# Patient Record
Sex: Female | Born: 1975 | Race: Black or African American | Hispanic: No | Marital: Single | Smoking: Current every day smoker
Health system: Southern US, Community
[De-identification: ages and names within clinical notes are randomized; demographics above are authoritative.]

## PROBLEM LIST (undated history)

## (undated) DIAGNOSIS — E785 Hyperlipidemia, unspecified: Secondary | ICD-10-CM

## (undated) DIAGNOSIS — J45909 Unspecified asthma, uncomplicated: Secondary | ICD-10-CM

## (undated) HISTORY — DX: Hyperlipidemia, unspecified: E78.5

## (undated) HISTORY — PX: FOOT SURGERY: SHX648

## (undated) HISTORY — DX: Unspecified asthma, uncomplicated: J45.909

---

## 2004-11-06 ENCOUNTER — Emergency Department: Payer: Self-pay | Admitting: Emergency Medicine

## 2005-02-18 ENCOUNTER — Emergency Department: Payer: Self-pay | Admitting: Emergency Medicine

## 2007-02-01 ENCOUNTER — Emergency Department: Payer: Self-pay | Admitting: Emergency Medicine

## 2007-12-31 ENCOUNTER — Ambulatory Visit: Payer: Self-pay

## 2009-07-02 ENCOUNTER — Emergency Department: Payer: Self-pay | Admitting: Emergency Medicine

## 2009-11-19 ENCOUNTER — Emergency Department: Payer: Self-pay | Admitting: Emergency Medicine

## 2010-04-14 ENCOUNTER — Emergency Department: Payer: Self-pay | Admitting: Emergency Medicine
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
## 2012-12-14 ENCOUNTER — Emergency Department: Payer: Self-pay | Admitting: Emergency Medicine

## 2012-12-14 LAB — URINALYSIS, COMPLETE
Bacteria: NONE SEEN
Bilirubin,UR: NEGATIVE
Glucose,UR: NEGATIVE mg/dL (ref 0–75)
Ketone: NEGATIVE
Leukocyte Esterase: NEGATIVE
Nitrite: NEGATIVE
Ph: 5 (ref 4.5–8.0)
Protein: NEGATIVE
RBC,UR: 9 /HPF (ref 0–5)
Specific Gravity: 1.032 (ref 1.003–1.030)
Squamous Epithelial: 1
WBC UR: 1 /HPF (ref 0–5)

## 2014-08-07 ENCOUNTER — Ambulatory Visit: Payer: Self-pay | Admitting: Podiatry

## 2014-08-11 ENCOUNTER — Ambulatory Visit: Payer: Self-pay | Admitting: Podiatry

## 2014-08-20 ENCOUNTER — Emergency Department: Payer: Self-pay | Admitting: Emergency Medicine

## 2014-10-15 NOTE — Op Note (Signed)
PATIENT NAME:  Kimberly Burns, Kimberly Burns MR#:  [id number] DATE OF BIRTH:  04-04-1976  DATE OF PROCEDURE:  08/11/2014  SURGEON: Linus Galas, DPM  PREOPERATIVE DIAGNOSIS: Hallux valgus deformity, left foot.   POSTOPERATIVE DIAGNOSIS: Hallux valgus deformity, left foot.   PROCEDURES: 1.  Modified McBride bunionectomy, left foot.  2.  Base wedge osteotomy, left first metatarsal.   ANESTHESIA: General endotracheal.  HEMOSTASIS: Pneumatic tourniquet, left ankle, 250 mmHg.   ESTIMATED BLOOD LOSS: Minimal.   MATERIALS: 15 mm compression staple, 13 mm compression staple.   PATHOLOGY: None.   COMPLICATIONS: None apparent.   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 She has tried conservative care outpatient and elects for surgical correction of the deformity.   OPERATIVE PROCEDURE: The patient was taken to the Operating Room and placed on the table in the supine position. Following satisfactory general endotracheal anesthesia, the left foot was anesthetized with 10 mL of 0.5% Sensorcaine plain around the medial aspect of the foot at the base of the first metatarsal. A pneumatic tourniquet was applied at the level of the left ankle. The foot was prepped and draped in the usual sterile fashion. The foot was exsanguinated and the tourniquet inflated to 250 mmHg.   Attention was then directed to the dorsal aspect of the left foot where an approximate 4 cm linear incision was made coursing proximal to distal over the metatarsal head and metatarsophalangeal joint. The incision was deepened via sharp and blunt dissection down to the level of the joint where a linear capsulotomy was performed. The capsular and periosteal tissues reflected off of the medial and dorsal head of the first metatarsal where there was noted to be significant dorsal spur formation as well as a medial prominence with some spur. Using a pneumatic saw, the medial and dorsal  prominences were resected in toto and the edges were rasped smooth. The wound was flushed with copious amounts of sterile saline.  Attention was then directed more proximal where the incision was extended by another 5 cm, more proximally to the base of the first metatarsal. The incision was deepened via sharp and blunt dissection down to the level of the base of the first metatarsal where a periosteal incision was made dorsally. The periosteal tissues reflected off the base of the first metatarsal. Using a pneumatic saw, a wedge osteotomy with apex medial and base lateral was performed and the wedge of bone removed. The osteotomy was then feathered with multiple passes of the pneumatic saw to achieve good rectus alignment of the first metatarsal and closure of the osteotomy. This was confirmed using intraoperative Fluoroscan views.  Next, a 15 mm compression staple was placed medially across the osteotomy using standard technique.  Next, a 13 mm staple was placed dorsally across the osteotomy, again using standard technique. Intraoperative Fluoroscan views revealed good reduction of the osteotomy and placement of the fixation. The wounds were then flushed again with copious amounts of sterile saline and closed using 4-0 Vicryl for all layers from periosteum and capsular tissue to deep and superficial subcutaneous and followed by skin closure. Tincture of benzoin and Steri-Strips applied, followed by Xeroform and a sterile bandage. The tourniquet was released and blood flow noted to return immediately to the left foot. A posterior splint was then applied to the left foot and ankle with the foot 90 degrees relative to the leg. The patient tolerated the procedure and anesthesia well, was extubated, and transported  to the PACU with vital signs stable and in good condition.    ____________________________ Linus Galasodd Kimberly Burns, DPM tc:sw D: 08/11/2014 09:57:14 ET T: 08/12/2014 09:21:08 ET JOB#: [id number]  cc: Linus Galasodd  Dax Murguia, DPM, <Dictator> Nicholas Ossa DPM ELECTRONICALLY SIGNED 08/16/2014 9:11

## 2014-12-10 ENCOUNTER — Encounter: Payer: Self-pay | Admitting: Emergency Medicine

## 2014-12-10 ENCOUNTER — Emergency Department
Admission: EM | Admit: 2014-12-10 | Discharge: 2014-12-10 | Disposition: A | Discharge status: Home / Self Care | Payer: Medicaid Other | Attending: Emergency Medicine | Admitting: Emergency Medicine

## 2014-12-10 DIAGNOSIS — Z720 Tobacco use: Secondary | ICD-10-CM | POA: Diagnosis not present

## 2014-12-10 DIAGNOSIS — Z3202 Encounter for pregnancy test, result negative: Secondary | ICD-10-CM | POA: Diagnosis not present

## 2014-12-10 DIAGNOSIS — R300 Dysuria: Secondary | ICD-10-CM | POA: Diagnosis not present

## 2014-12-10 LAB — URINALYSIS COMPLETE WITH MICROSCOPIC (ARMC ONLY)
BACTERIA UA: NONE SEEN
Bilirubin Urine: NEGATIVE
Glucose, UA: NEGATIVE mg/dL
KETONES UR: NEGATIVE mg/dL
NITRITE: NEGATIVE
Protein, ur: NEGATIVE mg/dL
SPECIFIC GRAVITY, URINE: 1.023 (ref 1.005–1.030)
pH: 5 (ref 5.0–8.0)

## 2014-12-10 LAB — POCT PREGNANCY, URINE: Preg Test, Ur: NEGATIVE

## 2014-12-10 MED ORDER — PHENAZOPYRIDINE HCL 200 MG PO TABS: 200.0000 mg | ORAL_TABLET | Freq: Three times a day (TID) | ORAL | Status: AC | PRN

## 2014-12-10 NOTE — ED Notes (Signed)
Pt states burning with urination and foul smell to urine that started 4 days ago. Pt also complains of back pain and lower abdominal pain.
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
## 2014-12-10 NOTE — Discharge Instructions (Signed)

## 2018-01-28 ENCOUNTER — Emergency Department: Payer: Self-pay

## 2018-01-28 ENCOUNTER — Other Ambulatory Visit: Payer: Self-pay

## 2018-01-28 ENCOUNTER — Encounter: Payer: Self-pay | Admitting: Emergency Medicine

## 2018-01-28 ENCOUNTER — Emergency Department
Admission: EM | Admit: 2018-01-28 | Discharge: 2018-01-28 | Disposition: A | Discharge status: Home / Self Care | Payer: Self-pay | Attending: Emergency Medicine | Admitting: Emergency Medicine

## 2018-01-28 DIAGNOSIS — R109 Unspecified abdominal pain: Secondary | ICD-10-CM

## 2018-01-28 DIAGNOSIS — F17210 Nicotine dependence, cigarettes, uncomplicated: Secondary | ICD-10-CM | POA: Insufficient documentation

## 2018-01-28 DIAGNOSIS — K30 Functional dyspepsia: Secondary | ICD-10-CM | POA: Insufficient documentation

## 2018-01-28 DIAGNOSIS — R1013 Epigastric pain: Secondary | ICD-10-CM

## 2018-01-28 LAB — URINALYSIS, COMPLETE (UACMP) WITH MICROSCOPIC
Bacteria, UA: NONE SEEN
Bilirubin Urine: NEGATIVE
GLUCOSE, UA: NEGATIVE mg/dL
Hgb urine dipstick: NEGATIVE
KETONES UR: NEGATIVE mg/dL
LEUKOCYTES UA: NEGATIVE
Nitrite: NEGATIVE
PH: 5 (ref 5.0–8.0)
PROTEIN: NEGATIVE mg/dL
Specific Gravity, Urine: 1.025 (ref 1.005–1.030)

## 2018-01-28 LAB — CBC
HCT: 39.4 % (ref 35.0–47.0)
Hemoglobin: 13.3 g/dL (ref 12.0–16.0)
MCH: 28.1 pg (ref 26.0–34.0)
MCHC: 33.8 g/dL (ref 32.0–36.0)
MCV: 83.2 fL (ref 80.0–100.0)
Platelets: 118 10*3/uL — ABNORMAL LOW (ref 150–440)
RBC: 4.74 MIL/uL (ref 3.80–5.20)
RDW: 14.1 % (ref 11.5–14.5)
WBC: 7 10*3/uL (ref 3.6–11.0)

## 2018-01-28 LAB — COMPREHENSIVE METABOLIC PANEL
ALT: 33 U/L (ref 0–44)
AST: 29 U/L (ref 15–41)
Albumin: 3.9 g/dL (ref 3.5–5.0)
Alkaline Phosphatase: 46 U/L (ref 38–126)
Anion gap: 5 (ref 5–15)
BUN: 10 mg/dL (ref 6–20)
CHLORIDE: 108 mmol/L (ref 98–111)
CO2: 26 mmol/L (ref 22–32)
Calcium: 9.3 mg/dL (ref 8.9–10.3)
Creatinine, Ser: 0.63 mg/dL (ref 0.44–1.00)
Glucose, Bld: 93 mg/dL (ref 70–99)
Potassium: 3.7 mmol/L (ref 3.5–5.1)
Sodium: 139 mmol/L (ref 135–145)
Total Bilirubin: 0.6 mg/dL (ref 0.3–1.2)
Total Protein: 7.1 g/dL (ref 6.5–8.1)

## 2018-01-28 LAB — LIPASE, BLOOD: LIPASE: 21 U/L (ref 11–51)

## 2018-01-28 LAB — POC URINE PREG, ED: Preg Test, Ur: NEGATIVE

## 2018-01-28 MED ORDER — GI COCKTAIL ~~LOC~~
30.0000 mL | Freq: Once | ORAL | Status: AC
  Administered 2018-01-28: 30 mL via ORAL
  Filled 2018-01-28: qty 30

## 2018-01-28 NOTE — ED Triage Notes (Signed)
Patient comes into the ED via POV c/o right flank pain that has been ongoing for weeks.  DEnies any h/o kidney stones or kidney problems.  Patient states she felt "a knot" a couple weeks ago and it has since then gone down.  Denies any urinary difficulties, vomiting, or diarrhea.  Patient states she has had some nausea associated with the  Symptoms.

## 2018-01-28 NOTE — ED Provider Notes (Signed)
[facility] Emergency Department Provider Note   ____________________________________________   First MD Initiated Contact with Patient 01/28/18 2038     (approximate)  I have reviewed the triage vital signs and the nursing notes.   HISTORY  Chief Complaint Flank Pain    HPI Mauri ReadingVicky L Koran is a 42 y.o. female no major medical history  Patient reports for about 2 weeks now she is had some intermittent discomfort in the right upper abdomen.  It first started about 2 weeks ago and feeling a crampy feeling below the right ribs.  No chest pain or trouble breathing.  No cough.  Reports that she has been feeling like an off-and-on achiness that comes and goes.  No clear cause, sometimes is present sometimes it comes back.  No nausea vomiting.  No abdominal pain or back pain except for some slight discomfort in the right upper abdomen.  Currently not having any pain.  Not associated with eating.  No fevers or chills.  Denies pregnancy.  No vaginal bleeding, on her current menstrual cycle which is been normal.  No pelvic pain.  History reviewed. No pertinent past medical history.  There are no active problems to display for this patient.   Past Surgical History:  Procedure Laterality Date  . FOOT SURGERY      Prior to Admission medications   Not on File    Allergies Patient has no known allergies.  No family history on file.  Social History Social History   Tobacco Use  . Smoking status: Current Every Day Smoker    Packs/day: 0.50    Types: Cigarettes  . Smokeless tobacco: Never Used  Substance Use Topics  . Alcohol use: Yes    Comment: Occasional  . Drug use: No    Review of Systems Constitutional: No fever/chills Eyes: No visual changes. ENT: No sore throat.  She has noticed a strange taste in the back of her mouth for the last couple mornings when she wakes up, been happening off and on a little bit over the last 2 weeks as well.  No  dental pain. Cardiovascular: Denies chest pain. Respiratory: Denies shortness of breath. Gastrointestinal: Eating normally.  No nausea, no vomiting.  No diarrhea.  No constipation. Genitourinary: Negative for dysuria.  Denies pregnancy. Musculoskeletal: Negative for back pain. Skin: Negative for rash. Neurological: Negative for headaches, focal weakness or numbness.    ____________________________________________   PHYSICAL EXAM:  VITAL SIGNS: ED Triage Vitals [01/28/18 2014]  Enc Vitals Group     BP      Pulse      Resp      Temp      Temp src      SpO2      Weight 180 lb (81.6 kg)     Height 5\' 2"  (1.575 m)     Head Circumference      Peak Flow      Pain Score 8     Pain Loc      Pain Edu?      Excl. in GC?     Constitutional: Alert and oriented. Well appearing and in no acute distress.  Very pleasant. Eyes: Conjunctivae are normal. Head: Atraumatic. Nose: No congestion/rhinnorhea. Mouth/Throat: Mucous membranes are moist. Neck: No stridor.   Cardiovascular: Normal rate, regular rhythm. Grossly normal heart sounds.  Good peripheral circulation. Respiratory: Normal respiratory effort.  No retractions. Lungs CTAB. Gastrointestinal: Soft and nontender with some mild tenderness in the epigastrium with no  rebound or guarding. No distention.  No discomfort in the right lower quadrant.  Negative Murphy.  Very reassuring exam without any evidence of an acute abdomen. Musculoskeletal: No lower extremity tenderness nor edema. Neurologic:  Normal speech and language. No gross focal neurologic deficits are appreciated.  Skin:  Skin is warm, dry and intact. No rash noted. Psychiatric: Mood and affect are normal. Speech and behavior are normal.  ____________________________________________   LABS (all labs ordered are listed, but only abnormal results are displayed)  Labs Reviewed  URINALYSIS, COMPLETE (UACMP) WITH MICROSCOPIC - Abnormal; Notable for the following  components:      Result Value   Color, Urine YELLOW (*)    APPearance CLEAR (*)    All other components within normal limits  CBC - Abnormal; Notable for the following components:   Platelets 118 (*)    All other components within normal limits  COMPREHENSIVE METABOLIC PANEL  LIPASE, BLOOD  POC URINE PREG, ED   ____________________________________________  EKG   ____________________________________________  RADIOLOGY  Right upper quadrant ultrasound unremarkable reviewed by me ____________________________________________   PROCEDURES  Procedure(s) performed: None  Procedures  Critical Care performed: No  ____________________________________________   INITIAL IMPRESSION / ASSESSMENT AND PLAN / ED COURSE  Pertinent labs & imaging results that were available during my care of the patient were reviewed by me and considered in my medical decision making (see chart for details).  Differential diagnosis includes but is not limited to, abdominal perforation, aortic dissection, cholecystitis, appendicitis, diverticulitis, colitis, esophagitis/gastritis, kidney stone, pyelonephritis, urinary tract infection, aortic aneurysm. All are considered in decision and treatment plan. Based upon the patient's presentation and risk factors, I suspect with the symptoms she is having this may be related to some type of peptic disease, feel less likely to be biliary in nature.  Very clinically reassuring exam and her symptoms been off and on for about 2 weeks time.  No cardiac or pulmonary symptoms.  All lab work is returned very reassuring.  No white count no fever.  Ultrasound also negative for acute.  After receiving GI cocktail patient reports all symptoms have resolved.  Resting comfortably with her grandbaby on her chest resting.  We discussed return precautions and careful follow-up and [facility]  The interim she plans to start taking over-the-counter  ranitidine to assess for improvement.  Return precautions and treatment recommendations and follow-up discussed with the patient who is agreeable with the plan.        ____________________________________________   FINAL CLINICAL IMPRESSION(S) / ED DIAGNOSES  Final diagnoses:  Abdominal pain  Dyspepsia      NEW MEDICATIONS STARTED DURING THIS VISIT:  There are no discharge medications for this patient.    Note:  This document was prepared using Dragon voice recognition software and may include unintentional dictation errors.     Sharyn CreamerQuale, Takeila Thayne, MD 01/29/18 [phone]

## 2018-01-28 NOTE — Discharge Instructions (Signed)
You were seen in the emergency room for abdominal pain. It is important that you follow up closely with your primary care doctor in the next couple of days. ° ° °Please return to the emergency room right away if you are to develop a fever, severe nausea, your pain becomes severe or worsens, you are unable to keep food down, begin vomiting any dark or bloody fluid, you develop any dark or bloody stools, feel dehydrated, or other new concerns or symptoms arise. ° ° °

## 2018-10-13 ENCOUNTER — Emergency Department
Admission: EM | Admit: 2018-10-13 | Discharge: 2018-10-13 | Disposition: A | Discharge status: Home / Self Care | Payer: Medicaid Other | Attending: Emergency Medicine | Admitting: Emergency Medicine

## 2018-10-13 ENCOUNTER — Other Ambulatory Visit: Payer: Self-pay

## 2018-10-13 ENCOUNTER — Emergency Department: Payer: Medicaid Other

## 2018-10-13 DIAGNOSIS — R079 Chest pain, unspecified: Secondary | ICD-10-CM

## 2018-10-13 DIAGNOSIS — F17210 Nicotine dependence, cigarettes, uncomplicated: Secondary | ICD-10-CM | POA: Insufficient documentation

## 2018-10-13 DIAGNOSIS — K219 Gastro-esophageal reflux disease without esophagitis: Secondary | ICD-10-CM

## 2018-10-13 LAB — BASIC METABOLIC PANEL
Anion gap: 9 (ref 5–15)
BUN: 12 mg/dL (ref 6–20)
CO2: 24 mmol/L (ref 22–32)
Calcium: 9.5 mg/dL (ref 8.9–10.3)
Chloride: 103 mmol/L (ref 98–111)
Creatinine, Ser: 0.84 mg/dL (ref 0.44–1.00)
GFR calc Af Amer: >60 mL/min (ref >60)
GFR calc non Af Amer: >60 mL/min (ref >60)
Glucose, Bld: 87 mg/dL (ref 70–99)
Potassium: 4 mmol/L (ref 3.5–5.1)
Sodium: 136 mmol/L (ref 135–145)

## 2018-10-13 LAB — TROPONIN I: Troponin I: <0.03 ng/mL (ref <0.03)

## 2018-10-13 LAB — CBC
HCT: 41.3 % (ref 36.0–46.0)
Hemoglobin: 13.7 g/dL (ref 12.0–15.0)
MCH: 27.2 pg (ref 26.0–34.0)
MCHC: 33.2 g/dL (ref 30.0–36.0)
MCV: 81.9 fL (ref 80.0–100.0)
Platelets: 163 10*3/uL (ref 150–400)
RBC: 5.04 MIL/uL (ref 3.87–5.11)
RDW: 14.3 % (ref 11.5–15.5)
WBC: 9.8 10*3/uL (ref 4.0–10.5)
nRBC: 0 % (ref 0.0–0.2)

## 2018-10-13 LAB — HEPATIC FUNCTION PANEL
ALT: 25 U/L (ref 0–44)
AST: 24 U/L (ref 15–41)
Albumin: 4.4 g/dL (ref 3.5–5.0)
Alkaline Phosphatase: 55 U/L (ref 38–126)
Bilirubin, Direct: <0.1 mg/dL (ref 0.0–0.2)
Total Bilirubin: 0.7 mg/dL (ref 0.3–1.2)
Total Protein: 7.7 g/dL (ref 6.5–8.1)

## 2018-10-13 LAB — LIPASE, BLOOD: Lipase: 21 U/L (ref 11–51)

## 2018-10-13 MED ORDER — ALUM & MAG HYDROXIDE-SIMETH 200-200-20 MG/5ML PO SUSP
30.0000 mL | Freq: Once | ORAL | Status: AC
  Administered 2018-10-13: 18:00:00 30 mL via ORAL
  Filled 2018-10-13: qty 30

## 2018-10-13 MED ORDER — LIDOCAINE VISCOUS HCL 2 % MT SOLN
15.0000 mL | Freq: Once | OROMUCOSAL | Status: AC
  Administered 2018-10-13: 15 mL via ORAL
  Filled 2018-10-13: qty 15

## 2018-10-13 MED ORDER — FAMOTIDINE 40 MG PO TABS: 40.0000 mg | ORAL_TABLET | Freq: Every day | ORAL | 1 refills | Status: DC

## 2018-10-13 NOTE — ED Notes (Signed)
Assumed care of patient reports chest pain x 2 weeks, parient describes pain as a chest cold. ekg obtained and given to md. Patient sitting up in bed on phone. Po meds given as ordered will monitor.

## 2018-10-13 NOTE — ED Notes (Signed)
Patient feeling better discharged to home.

## 2018-10-13 NOTE — ED Provider Notes (Signed)
[facility] Emergency Department Provider Note  ____________________________________________  Time seen: Approximately 6:18 PM  I have reviewed the triage vital signs and the nursing notes.   HISTORY  Chief Complaint Chest Pain   HPI Kimberly Burns is a 43 y.o. female with history of smoking and GERD who presents for evaluation of chest pain.  Patient is complaining of constant soreness in the center of her chest for 2 weeks.  She reports that he feels like a chest cold.  She has no shortness of breath, no cough, no fever, no vomiting, no abdominal pain, no diarrhea.  No known exposure to COVID-19 or travel to endemic regions.  She denies personal or family history of heart attacks, blood clots, recent travel immobilization, leg pain or swelling, hemoptysis, exogenous hormones, or history of cancer.  She has had mild nausea.  She reports that sometimes she feels that her heart is skipping beats.  PMH None - reviewed  Past Surgical History:  Procedure Laterality Date   FOOT SURGERY      Prior to Admission medications   Medication Sig Start Date End Date Taking? Authorizing Provider  famotidine (PEPCID) 40 MG tablet Take 1 tablet (40 mg total) by mouth at bedtime. 10/13/18 10/13/19  Nita Sickle, MD    Allergies Patient has no known allergies.  FH Diabetes type II Father    Social History Social History   Tobacco Use   Smoking status: Current Every Day Smoker    Packs/day: 0.50    Types: Cigarettes   Smokeless tobacco: Never Used  Substance Use Topics   Alcohol use: Yes    Comment: Occasional   Drug use: No    Review of Systems  Constitutional: Negative for fever. Eyes: Negative for visual changes. ENT: Negative for sore throat. Neck: No neck pain  Cardiovascular: + chest pain and fluttering Respiratory: Negative for shortness of breath. Gastrointestinal: Negative for abdominal pain, vomiting or diarrhea. Genitourinary:  Negative for dysuria. Musculoskeletal: Negative for back pain. Skin: Negative for rash. Neurological: Negative for headaches, weakness or numbness. Psych: No SI or HI  ____________________________________________   PHYSICAL EXAM:  VITAL SIGNS: ED Triage Vitals  Enc Vitals Group     BP 10/13/18 1658 139/77     Pulse Rate 10/13/18 1658 77     Resp 10/13/18 1658 16     Temp 10/13/18 1658 98.6 F (37 C)     Temp Source 10/13/18 1658 Oral     SpO2 10/13/18 1658 96 %     Weight 10/13/18 1656 180 lb (81.6 kg)     Height 10/13/18 1656 5\' 2"  (1.575 m)     Head Circumference --      Peak Flow --      Pain Score 10/13/18 1656 8     Pain Loc --      Pain Edu? --      Excl. in GC? --     Constitutional: Alert and oriented. Well appearing and in no apparent distress. HEENT:      Head: Normocephalic and atraumatic.         Eyes: Conjunctivae are normal. Sclera is non-icteric.       Mouth/Throat: Mucous membranes are moist.       Neck: Supple with no signs of meningismus. Cardiovascular: Regular rate and rhythm. No murmurs, gallops, or rubs. 2+ symmetrical distal pulses are present in all extremities. No JVD. Respiratory: Normal respiratory effort. Lungs are clear to auscultation bilaterally. No wheezes, crackles,  or rhonchi.  Gastrointestinal: Soft, non tender, and non distended with positive bowel sounds. No rebound or guarding. Musculoskeletal: Nontender with normal range of motion in all extremities. No edema, cyanosis, or erythema of extremities. Neurologic: Normal speech and language. Face is symmetric. Moving all extremities. No gross focal neurologic deficits are appreciated. Skin: Skin is warm, dry and intact. No rash noted. Psychiatric: Mood and affect are normal. Speech and behavior are normal.  ____________________________________________   LABS (all labs ordered are listed, but only abnormal results are displayed)  Labs Reviewed  BASIC METABOLIC PANEL  CBC  TROPONIN  I  HEPATIC FUNCTION PANEL  LIPASE, BLOOD  POC URINE PREG, ED   ____________________________________________  EKG  ED ECG REPORT I, Nita Sicklearolina Hiep Ollis, the attending physician, personally viewed and interpreted this ECG.  Normal sinus rhythm, rate of 73, normal intervals, normal axis, no ST elevations or depressions.  Normal EKG ____________________________________________  RADIOLOGY  I have personally reviewed the images performed during this visit and I agree with the Radiologist's read.   Interpretation by Radiologist:  Dg Chest 2 View  Result Date: 10/13/2018 CLINICAL DATA:  Chest pain and malaise for 2 weeks. EXAM: CHEST - 2 VIEW COMPARISON:  07/02/2009 FINDINGS: The heart size and mediastinal contours are within normal limits. Both lungs are clear. The visualized skeletal structures are unremarkable. IMPRESSION: Negative.  No active cardiopulmonary disease. Electronically Signed   By: Myles RosenthalJohn  Stahl M.D.   On: 10/13/2018 18:04     ____________________________________________   PROCEDURES  Procedure(s) performed: None Procedures Critical Care performed:  None ____________________________________________   INITIAL IMPRESSION / ASSESSMENT AND PLAN / ED COURSE   43 y.o. female with history of smoking and GERD who presents for evaluation of constant soreness in the center of her chest for 2 weeks. Heart score 1 for smoking.  EKG normal.  First troponin negative.  No need for repeat troponin since patient has had pain constantly for 2 weeks.  Chest x-ray no evidence of pneumothorax, pneumonia, pulmonary edema.  Will try GI cocktail.  Labs showing no acute findings.  Differential diagnosis including GERD versus gastritis versus peptic ulcer disease.  Less likely cardiac in nature with 2 weeks of pain with normal EKG and troponin.  With description of fluttering sensation in her chest I will monitor her on telemetry to see if patient has any evidence of dysrhythmias.  No dysrhythmias  seen on EKG, no prolonged QT, no delta wave, no WPW, no HOCM.    _________________________ 7:22 PM on 10/13/2018 -----------------------------------------  Patient monitor on telemetry with no dysrhythmias observed.  Patient does endorse resolution of her chest pain after GI cocktail.  Symptoms most likely consistent with GERD.  Will discharge patient on Pepcid 40 mg nightly.  Recommended follow-up with primary care doctor.  Discussed my standard return precautions.   As part of my medical decision making, I reviewed the following data within the electronic MEDICAL RECORD NUMBER Nursing notes reviewed and incorporated, Labs reviewed , EKG interpreted , Old EKG reviewed, Old chart reviewed, Radiograph reviewed , Notes from prior ED visits and [place] Controlled Substance Database    Pertinent labs & imaging results that were available during my care of the patient were reviewed by me and considered in my medical decision making (see chart for details).    ____________________________________________   FINAL CLINICAL IMPRESSION(S) / ED DIAGNOSES  Final diagnoses:  Chest pain, unspecified type  Gastroesophageal reflux disease, esophagitis presence not specified      NEW MEDICATIONS STARTED  DURING THIS VISIT:  ED Discharge Orders         Ordered    famotidine (PEPCID) 40 MG tablet  Daily at bedtime     10/13/18 1922           Note:  This document was prepared using Dragon voice recognition software and may include unintentional dictation errors.    Don Perking [place], MD 10/13/18 [id number]

## 2018-10-13 NOTE — ED Triage Notes (Signed)
Intermittent chest pain over last 2 weeks. Reports malaise, denies cough or fever. Pt alert and oriented X4, active, cooperative, pt in NAD. RR even and unlabored, color WNL.

## 2018-10-13 NOTE — ED Notes (Signed)
Placed pt on monitor. Took vitals, pt is now waiting on RN.

## 2020-01-05 ENCOUNTER — Other Ambulatory Visit: Payer: Self-pay | Admitting: Family Medicine

## 2020-01-05 DIAGNOSIS — Z1231 Encounter for screening mammogram for malignant neoplasm of breast: Secondary | ICD-10-CM

## 2020-02-16 IMAGING — CR CHEST - 2 VIEW
2 series · 2 of 2 positions shown · non-contrast
Comparison: 07/02/2009

CLINICAL DATA: Chest pain and malaise for 2 weeks.

EXAM:
CHEST - 2 VIEW

[chest pa]
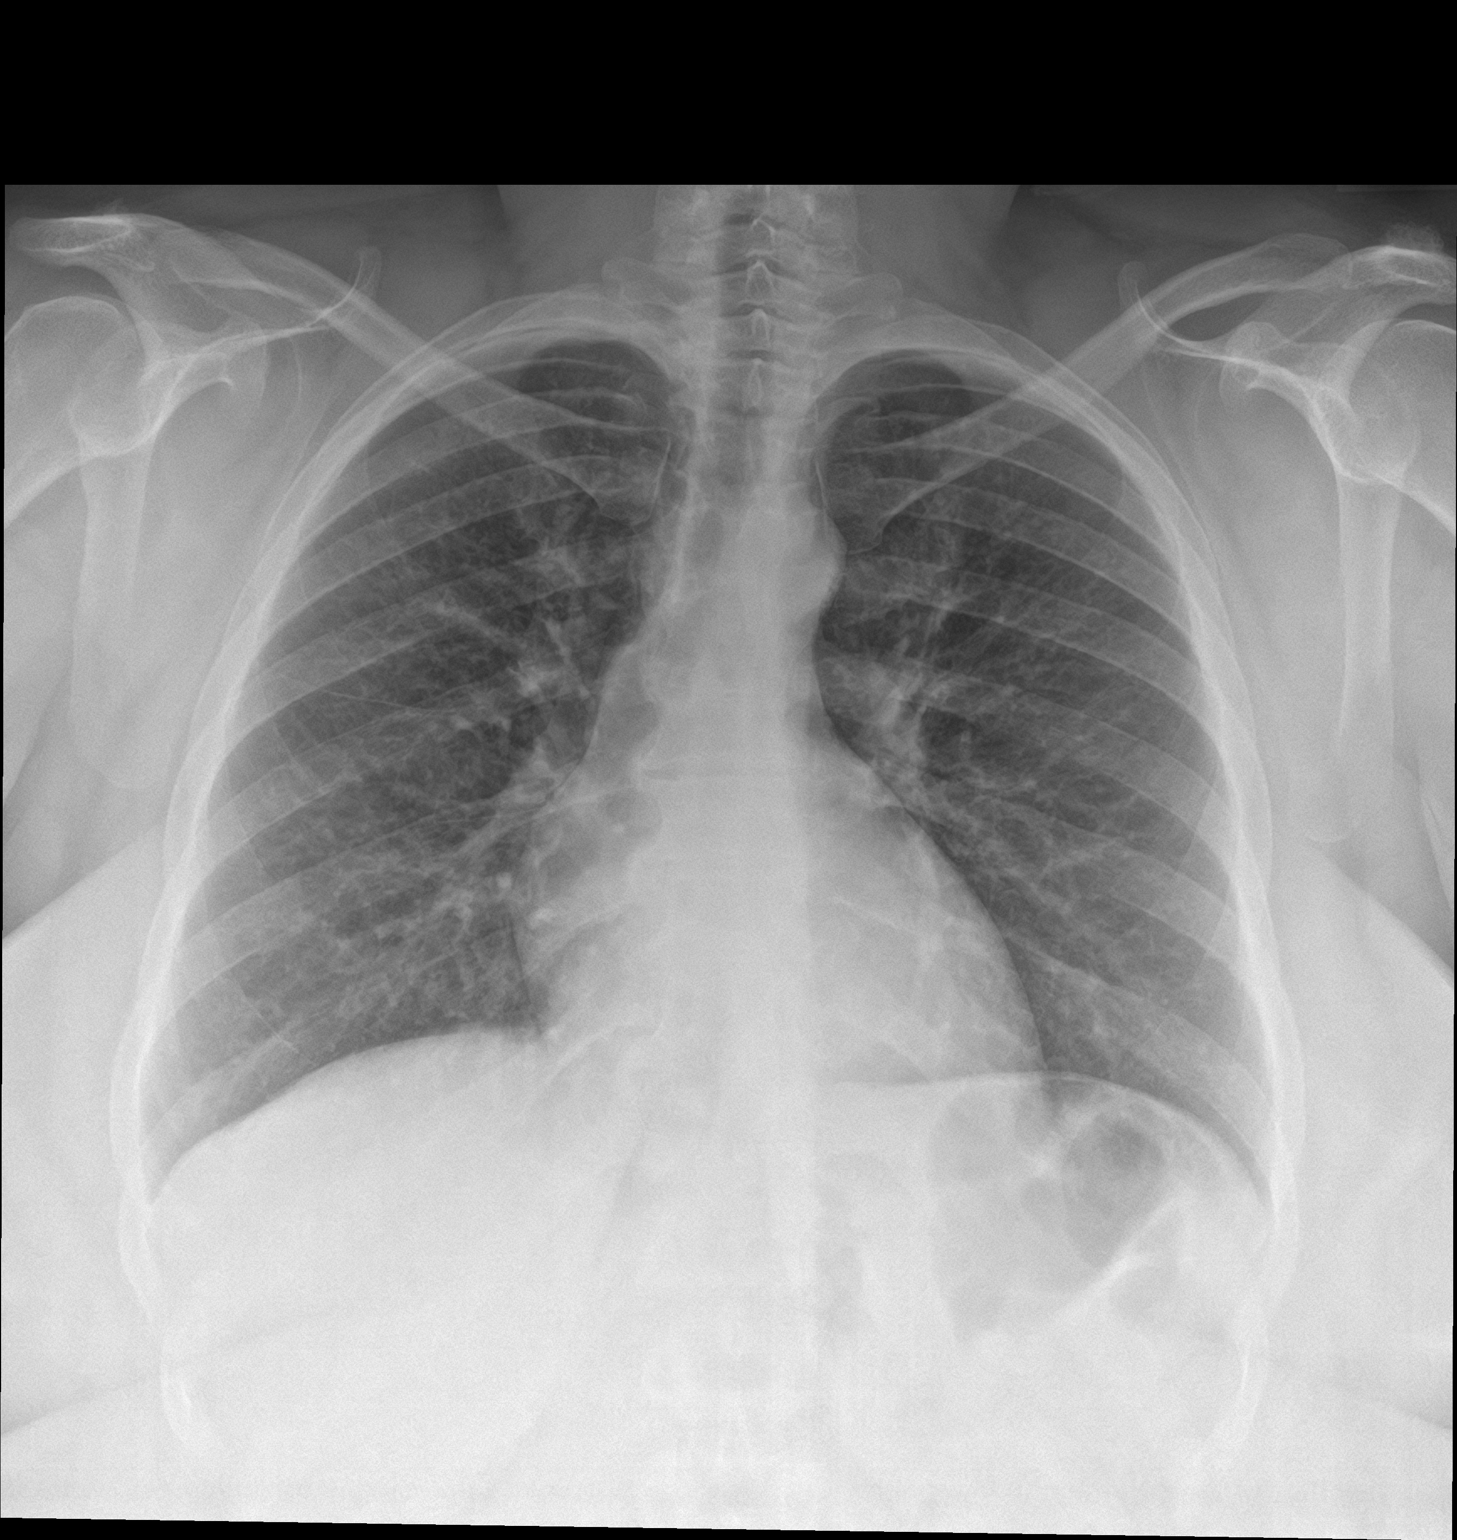

[chest lat]
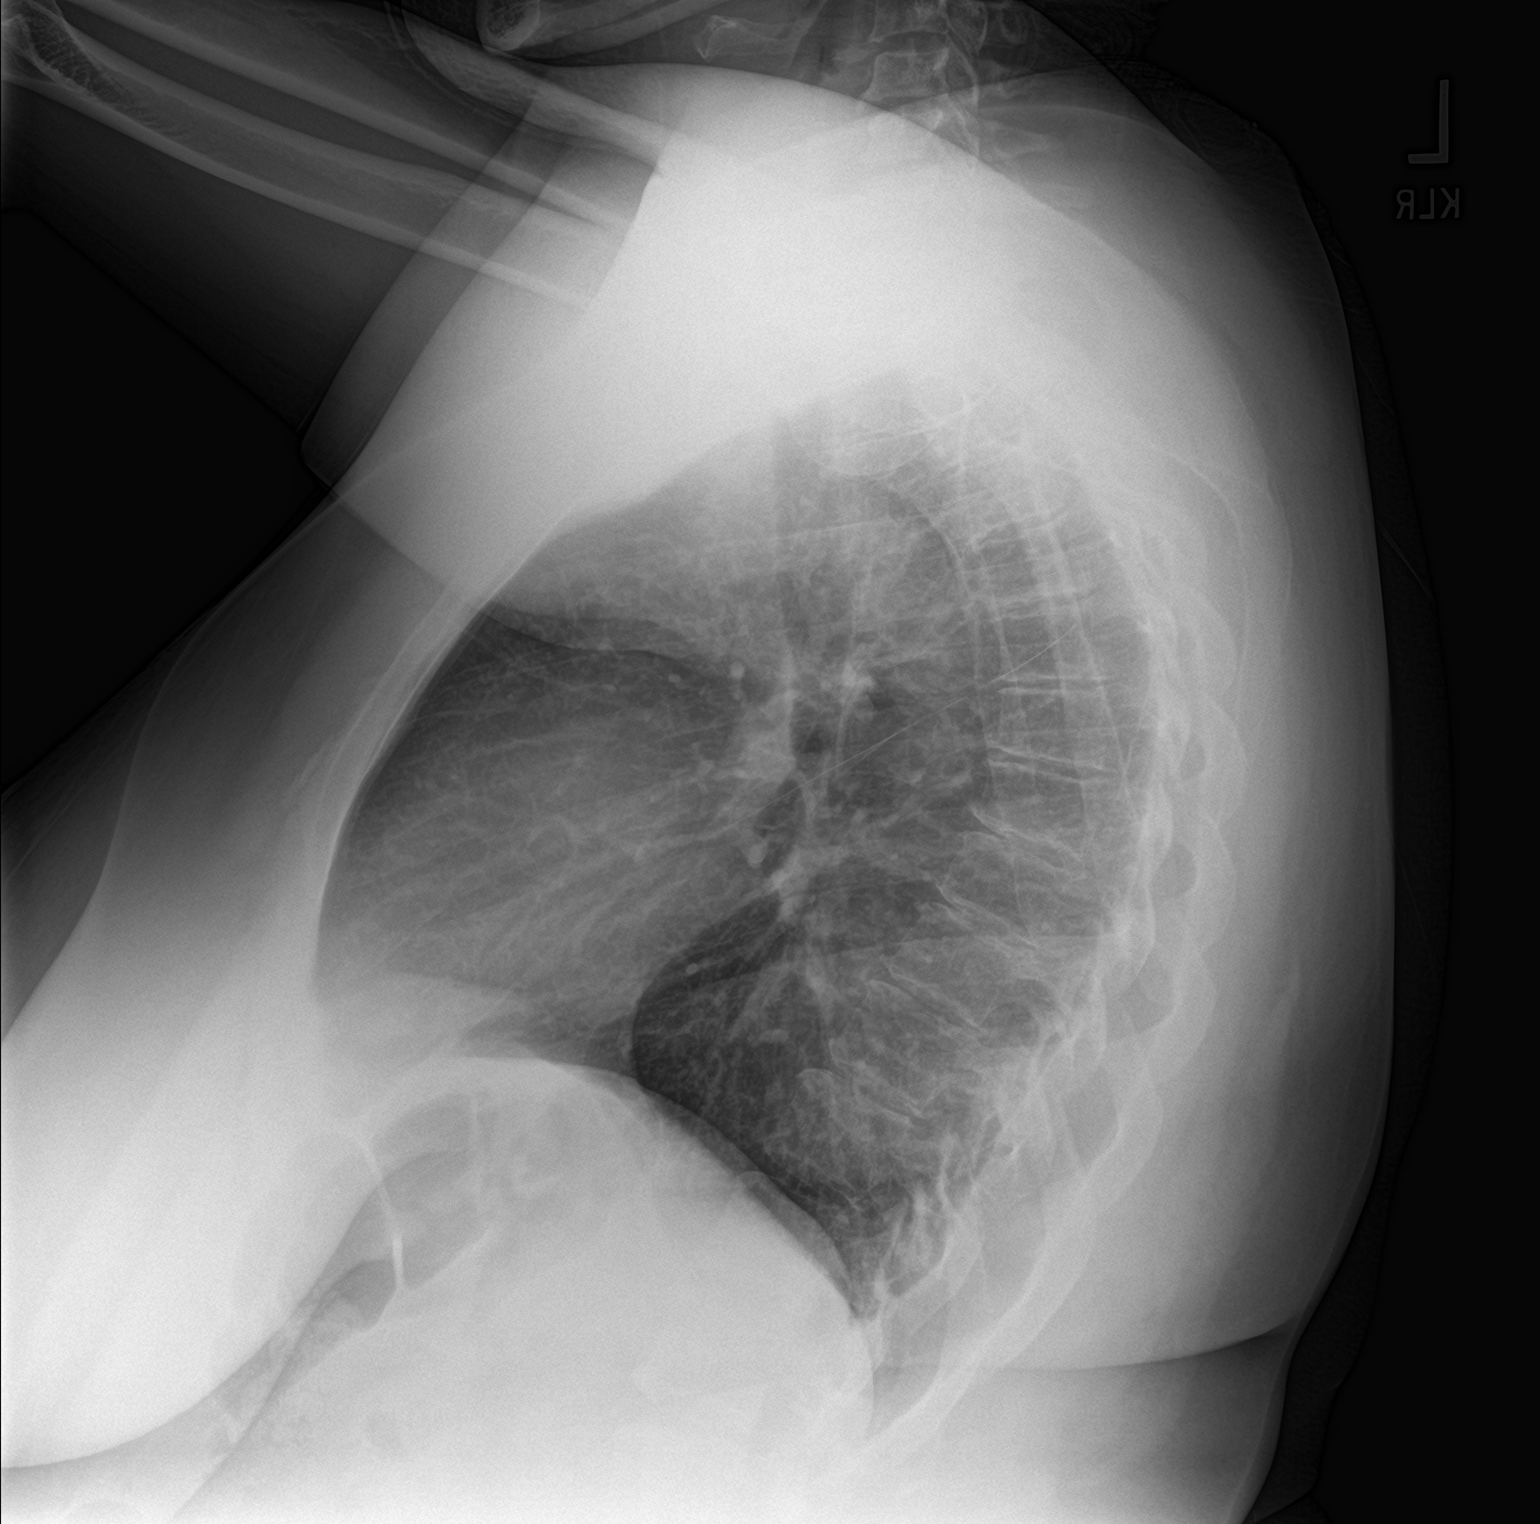

[2 of 2 positions shown; findings below may reference images not displayed]

FINDINGS: The heart size and mediastinal contours are within normal limits.
Both lungs are clear. The visualized skeletal structures are
unremarkable.
IMPRESSION: Negative.  No active cardiopulmonary disease.

## 2020-09-13 ENCOUNTER — Other Ambulatory Visit: Payer: Self-pay

## 2020-09-13 ENCOUNTER — Emergency Department
Admission: EM | Admit: 2020-09-13 | Discharge: 2020-09-14 | Disposition: A | Discharge status: Home / Self Care | Payer: Medicaid Other | Attending: Emergency Medicine | Admitting: Emergency Medicine

## 2020-09-13 DIAGNOSIS — F419 Anxiety disorder, unspecified: Secondary | ICD-10-CM | POA: Diagnosis present

## 2020-09-13 DIAGNOSIS — F17210 Nicotine dependence, cigarettes, uncomplicated: Secondary | ICD-10-CM | POA: Insufficient documentation

## 2020-09-13 DIAGNOSIS — R440 Auditory hallucinations: Secondary | ICD-10-CM | POA: Insufficient documentation

## 2020-09-13 DIAGNOSIS — R443 Hallucinations, unspecified: Secondary | ICD-10-CM

## 2020-09-13 DIAGNOSIS — F23 Brief psychotic disorder: Secondary | ICD-10-CM

## 2020-09-13 DIAGNOSIS — Z20822 Contact with and (suspected) exposure to covid-19: Secondary | ICD-10-CM | POA: Insufficient documentation

## 2020-09-13 LAB — CBC
HCT: 43.3 % (ref 36.0–46.0)
Hemoglobin: 14.2 g/dL (ref 12.0–15.0)
MCH: 26.9 pg (ref 26.0–34.0)
MCHC: 32.8 g/dL (ref 30.0–36.0)
MCV: 82.2 fL (ref 80.0–100.0)
Platelets: 158 10*3/uL (ref 150–400)
RBC: 5.27 MIL/uL — ABNORMAL HIGH (ref 3.87–5.11)
RDW: 13.6 % (ref 11.5–15.5)
WBC: 8.7 10*3/uL (ref 4.0–10.5)
nRBC: 0 % (ref 0.0–0.2)

## 2020-09-13 LAB — ETHANOL: Alcohol, Ethyl (B): <10 mg/dL (ref <10)

## 2020-09-13 LAB — RESP PANEL BY RT-PCR (FLU A&B, COVID) ARPGX2
Influenza A by PCR: NEGATIVE
Influenza B by PCR: NEGATIVE
SARS Coronavirus 2 by RT PCR: NEGATIVE

## 2020-09-13 LAB — COMPREHENSIVE METABOLIC PANEL
ALT: 25 U/L (ref 0–44)
AST: 28 U/L (ref 15–41)
Albumin: 4.6 g/dL (ref 3.5–5.0)
Alkaline Phosphatase: 49 U/L (ref 38–126)
Anion gap: 12 (ref 5–15)
BUN: 11 mg/dL (ref 6–20)
CO2: 22 mmol/L (ref 22–32)
Calcium: 9.6 mg/dL (ref 8.9–10.3)
Chloride: 107 mmol/L (ref 98–111)
Creatinine, Ser: 0.78 mg/dL (ref 0.44–1.00)
GFR, Estimated: >60 mL/min (ref >60)
Glucose, Bld: 96 mg/dL (ref 70–99)
Potassium: 3.2 mmol/L — ABNORMAL LOW (ref 3.5–5.1)
Sodium: 141 mmol/L (ref 135–145)
Total Bilirubin: 1 mg/dL (ref 0.3–1.2)
Total Protein: 8.1 g/dL (ref 6.5–8.1)

## 2020-09-13 LAB — ACETAMINOPHEN LEVEL: Acetaminophen (Tylenol), Serum: <10 ug/mL — ABNORMAL LOW (ref 10–30)

## 2020-09-13 LAB — SALICYLATE LEVEL: Salicylate Lvl: <7 mg/dL — ABNORMAL LOW (ref 7.0–30.0)

## 2020-09-13 LAB — PREGNANCY, URINE: Preg Test, Ur: NEGATIVE

## 2020-09-13 MED ORDER — OLANZAPINE 5 MG PO TABS
5.0000 mg | ORAL_TABLET | Freq: Every day | ORAL | Status: DC
  Administered 2020-09-14: 5 mg via ORAL
  Filled 2020-09-13: qty 1

## 2020-09-13 MED ORDER — LORAZEPAM 1 MG PO TABS
1.0000 mg | ORAL_TABLET | Freq: Once | ORAL | Status: AC
  Administered 2020-09-14: 1 mg via ORAL
  Filled 2020-09-13: qty 1

## 2020-09-13 NOTE — ED Notes (Signed)
Pt reassured that there are no men on unit and that her voice is a hallucination. Still awaiting on medication to be verified by pharmacy for administration. Pt has returned to bed in room

## 2020-09-13 NOTE — ED Notes (Signed)
Pt back to desk, states that the man that is talking is causing her to be fearful. There are no men on unit. Pt is experiencing AH at this time. Security assisting pt with communication.

## 2020-09-13 NOTE — BH Assessment (Addendum)
Comprehensive Clinical Assessment (CCA) Note  09/13/2020 Kimberly Burns [id number]  Chief Complaint: Patient is a 45 year old female presenting to [facility] ED voluntarily. Per triage note Pt states for the past couple of days she has been having auditory hallucinations that someone is threatening her saying they will kill her and she is responsible for someone else's death. Pt denies anything like this happening in past, pt states this all started when her sister told her someone was spreading rumors saying that she was dead. Pt denies SI/HI. During assessment patient appeared alert and oriented x4, cooperative, suspicious, paranoid, and anxious. Patient reports why she is presenting to the ED "just stressed, I got a lot going on, I just need a break I'm tired." "I heard that there was a rumor that I was dead." Patient also reports recently experiencing AH "the voices tell me that they are going to hurt me." Patient denies ever experiencing AH in the past. Patient denies SI and denies any past attempts to hurt herself. Patient also presents with persecutory delusions "I just feel like someone else is trying to hurt me." Patient also reports not sleeping "I don't want to sleep because I'm scared someone is trying to get me." Patient also reports a lack of appetite. Patient reports having a court hearing Next Wednesday and reports that "I'm ready to get that over with." Patient denies using drugs or alcohol recently. Patient tended to be fixated on the door in the room at the end of the assessment and asked the psychiatric team if anyone can get through the door, patient had be to be reassured that there wasn't anyone coming through the door. Patient denies any substance use but UDS is positive for Cannabinoids. Patient denies SI/HI/VH continues to report AH  Per Psyc NP Elenore Paddy patient to be observed overnight and reassessed in the morning Chief Complaint  Patient presents with  . Mental Health Problem    Visit Diagnosis: Substance-Induced Psychosis   CCA Screening, Triage and Referral (STR)  Patient Reported Information How did you hear about [place]? Self  Referral name: No data recorded Referral phone number: No data recorded  Whom do you see for routine medical problems? Other (Comment)  Practice/Facility Name: No data recorded Practice/Facility Phone Number: No data recorded Name of Contact: No data recorded Contact Number: No data recorded Contact Fax Number: No data recorded Prescriber Name: No data recorded Prescriber Address (if known): No data recorded  What Is the Reason for Your Visit/Call Today? No data recorded How Long Has This Been Causing You Problems? 1-6 months  What Do You Feel Would Help You the Most Today? No data recorded  Have You Recently Been in Any Inpatient Treatment (Hospital/Detox/Crisis Center/28-Day Program)? No  Name/Location of Program/Hospital:No data recorded How Long Were You There? No data recorded When Were You Discharged? No data recorded  Have You Ever Received Services From [facility] Before? No  Who Do You See at [facility]? No data recorded  Have You Recently Had Any Thoughts About Hurting Yourself? No  Are You Planning to Commit Suicide/Harm Yourself At This time? No   Have you Recently Had Thoughts About Hurting Someone Karolee Ohs? No  Explanation: No data recorded  Have You Used Any Alcohol or Drugs in the Past 24 Hours? No  How Long Ago Did You Use Drugs or Alcohol? No data recorded What Did You Use and How Much? No data recorded  Do You Currently Have a Therapist/Psychiatrist? No  Name  of Therapist/Psychiatrist: No data recorded  Have You Been Recently Discharged From Any Office Practice or Programs? No  Explanation of Discharge From Practice/Program: No data recorded    CCA Screening Triage Referral Assessment Type of Contact: Face-to-Face  Is this Initial or Reassessment? No data recorded Date Telepsych  consult ordered in [facility]:  No data recorded Time Telepsych consult ordered in [facility]:  No data recorded  Patient Reported Information Reviewed? Yes  Patient Left Without Being Seen? No data recorded Reason for Not Completing Assessment: No data recorded  Collateral Involvement: No data recorded  Does Patient Have a Court Appointed Legal Guardian? No data recorded Name and Contact of Legal Guardian: No data recorded If Minor and Not Living with Parent(s), Who has Custody? No data recorded Is CPS involved or ever been involved? Never  Is APS involved or ever been involved? Never   Patient Determined To Be At Risk for Harm To Self or Others Based on Review of Patient Reported Information or Presenting Complaint? No  Method: No data recorded Availability of Means: No data recorded Intent: No data recorded Notification Required: No data recorded Additional Information for Danger to Others Potential: No data recorded Additional Comments for Danger to Others Potential: No data recorded Are There Guns or Other Weapons in Your Home? No data recorded Types of Guns/Weapons: No data recorded Are These Weapons Safely Secured?                            No data recorded Who Could Verify You Are Able To Have These Secured: No data recorded Do You Have any Outstanding Charges, Pending Court Dates, Parole/Probation? No data recorded Contacted To Inform of Risk of Harm To Self or Others: No data recorded  Location of Assessment: [facility] ED   Does Patient Present under Involuntary Commitment? No  IVC Papers Initial File Date: No data recorded  [place] of Residence: [place]   Patient Currently Receiving the Following Services: No data recorded  Determination of Need: Emergent (2 hours)   Options For Referral: No data recorded    CCA Biopsychosocial Intake/Chief Complaint:  Patient presents voluntarily due to having delusions and experiencing AH  Current Symptoms/Problems: Patient presents  voluntarily due to having delusions and experiencing AH   Patient Reported Schizophrenia/Schizoaffective Diagnosis in Past: No   Strengths: Patient is able to communicate  Preferences: Unknown  Abilities: Patient is able to communicate   Type of Services Patient Feels are Needed: Unknown   Initial Clinical Notes/Concerns: None   Mental Health Symptoms Depression:  Sleep (too much or little); Increase/decrease in appetite; Difficulty Concentrating   Duration of Depressive symptoms: Less than two weeks   Mania:  None   Anxiety:   Difficulty concentrating; Tension; Worrying   Psychosis:  Delusions; Hallucinations   Duration of Psychotic symptoms: Less than six months   Trauma:  None   Obsessions:  None   Compulsions:  None   Inattention:  None   Hyperactivity/Impulsivity:  N/A   Oppositional/Defiant Behaviors:  None   Emotional Irregularity:  None   Other Mood/Personality Symptoms:  No data recorded   Mental Status Exam Appearance and self-care  Stature:  Average   Weight:  Average weight   Clothing:  Casual   Grooming:  Normal   Cosmetic use:  None   Posture/gait:  Normal   Motor activity:  Not Remarkable   Sensorium  Attention:  Normal   Concentration:  Normal  Orientation:  X5   Recall/memory:  Normal   Affect and Mood  Affect:  Anxious   Mood:  Anxious   Relating  Eye contact:  Normal   Facial expression:  Anxious   Attitude toward examiner:  Cooperative   Thought and Language  Speech flow: Clear and Coherent   Thought content:  Appropriate to Mood and Circumstances; Suspicious   Preoccupation:  Ruminations   Hallucinations:  Auditory; Command (Comment) (Patient reports that her voices tell her to hurt herself)   Organization:  No data recorded  [employer] of Knowledge:  Fair   Intelligence:  Average   Abstraction:  Concrete   Judgement:  Good   Reality Testing:  Realistic   Insight:  Good    Decision Making:  Normal   Social Functioning  Social Maturity:  Responsible   Social Judgement:  Normal   Stress  Stressors:  Surveyor, quantity; Optometrist Ability:  Normal   Skill Deficits:  None   Supports:  Family     Religion: Religion/Spirituality Are You A Religious Person?: No  Leisure/Recreation: Leisure / Recreation Do You Have Hobbies?: No  Exercise/Diet: Exercise/Diet Do You Exercise?: No Have You Gained or Lost A Significant Amount of Weight in the Past Six Months?: No Do You Follow a Special Diet?: No Do You Have Any Trouble Sleeping?: No   CCA Employment/Education Employment/Work Situation: Employment / Work Psychologist, occupational Employment situation: Unemployed Has patient ever been in the [employer]?: No  Education: Education Is Patient Currently Attending School?: No Did Garment/textile technologist From [employer]?: Yes Did You Have An Individualized Education Program (IIEP): No Did You Have Any Difficulty At [employer]?: No Patient's Education Has Been Impacted by Current Illness: No   CCA Family/Childhood History Family and Relationship History: Family history Marital status: Single Are you sexually active?:  (Unknown) What is your sexual orientation?: Unknown Has your sexual activity been affected by drugs, alcohol, medication, or emotional stress?: No Does patient have children?: Yes How many children?: 1 How is patient's relationship with their children?: Patient reports relationship with child is good  Childhood History:  Childhood History By whom was/is the patient raised?: Other (Comment) Additional childhood history information: None reported Description of patient's relationship with caregiver when they were a child: None reported Patient's description of current relationship with people who raised him/her: None reported How were you disciplined when you got in trouble as a child/adolescent?: None reported Does patient have siblings?: No Did patient  suffer any verbal/emotional/physical/sexual abuse as a child?: No Did patient suffer from severe childhood neglect?: No Has patient ever been sexually abused/assaulted/raped as an adolescent or adult?: No Was the patient ever a victim of a crime or a disaster?: No Witnessed domestic violence?: No Has patient been affected by domestic violence as an adult?: No  Child/Adolescent Assessment:     CCA Substance Use Alcohol/Drug Use: Alcohol / Drug Use Pain Medications: See MAR Prescriptions: See MAR Over the Counter: See MAR History of alcohol / drug use?: Yes Substance #1 Name of Substance 1: Alcohol 1 - Frequency: Patient reports drinking alcohol on occassion                       ASAM's:  Six Dimensions of Multidimensional Assessment  Dimension 1:  Acute Intoxication and/or Withdrawal Potential:      Dimension 2:  Biomedical Conditions and Complications:      Dimension 3:  Emotional, Behavioral, or Cognitive Conditions and Complications:  Dimension 4:  Readiness to Change:     Dimension 5:  Relapse, Continued use, or Continued Problem Potential:     Dimension 6:  Recovery/Living Environment:     ASAM Severity Score:    ASAM Recommended Level of Treatment:     Substance use Disorder (SUD)    Recommendations for Services/Supports/Treatments:   Per Psyc NP Elenore PaddyJackie Thompson patient to be observed overnight and reassessed in the morning  DSM5 Diagnoses: Patient Active Problem List   Diagnosis Date Noted  . Auditory hallucinations 09/13/2020  . Anxiety disorder 09/13/2020    Patient Centered Plan: Patient is on the following Treatment Plan(s):  Hallucinations    Referrals to Alternative Service(s): Referred to Alternative Service(s):   Place:   Date:   Time:    Referred to Alternative Service(s):   Place:   Date:   Time:    Referred to Alternative Service(s):   Place:   Date:   Time:    Referred to Alternative Service(s):   Place:   Date:   Time:      Druanne Bosques A Matthieu Loftus, LCAS-A

## 2020-09-13 NOTE — ED Notes (Signed)
This nurse contacts pharmacy to have medications ordered by verified. Informed that the pharmacist is getting food and would be notified when he returns. Will administer medications one they are verified and able to be collected from pyxis

## 2020-09-13 NOTE — ED Provider Notes (Signed)
[facility] Emergency Department Provider Note   ____________________________________________   Event Date/Time   First MD Initiated Contact with Patient 09/13/20 2022     (approximate)  I have reviewed the triage vital signs and the nursing notes.   HISTORY  Chief Complaint Mental Health Problem    HPI Kimberly Burns is a 45 y.o. female with no significant past medical history presents to the ED complaining of hallucinations.  Patient reports that over the past couple of weeks she has been hearing voices that have been increasingly intrusive.  She states that they say they are coming after her and that they are going to get her no matter what she does.  She describes it as a female voice but she denies any visual hallucinations.  She states she has scared for her safety and has no intention of harming herself or anyone else.  She denies any history of similar symptoms, denies any alcohol or drug use.  She denies any specific stressors in her life at this time.        History reviewed. No pertinent past medical history.  There are no problems to display for this patient.   Past Surgical History:  Procedure Laterality Date  . FOOT SURGERY      Prior to Admission medications   Medication Sig Start Date End Date Taking? Authorizing Provider  famotidine (PEPCID) 40 MG tablet Take 1 tablet (40 mg total) by mouth at bedtime. 10/13/18 10/13/19  Nita Sickle, MD    Allergies Patient has no known allergies.  No family history on file.  Social History Social History   Tobacco Use  . Smoking status: Current Every Day Smoker    Packs/day: 0.50    Types: Cigarettes  . Smokeless tobacco: Never Used  Substance Use Topics  . Alcohol use: Yes    Comment: Occasional  . Drug use: No    Review of Systems  Constitutional: No fever/chills Eyes: No visual changes. ENT: No sore throat. Cardiovascular: Denies chest pain. Respiratory: Denies  shortness of breath. Gastrointestinal: No abdominal pain.  No nausea, no vomiting.  No diarrhea.  No constipation. Genitourinary: Negative for dysuria. Musculoskeletal: Negative for back pain. Skin: Negative for rash. Neurological: Negative for headaches, focal weakness or numbness.  Positive for hallucinations.  ____________________________________________   PHYSICAL EXAM:  VITAL SIGNS: ED Triage Vitals  Enc Vitals Group     BP 09/13/20 2007 131/86     Pulse Rate 09/13/20 2007 85     Resp 09/13/20 2007 20     Temp 09/13/20 2007 98.7 F (37.1 C)     Temp Source 09/13/20 2007 Oral     SpO2 09/13/20 2007 95 %     Weight 09/13/20 2005 175 lb (79.4 kg)     Height 09/13/20 2005 5\' 2"  (1.575 m)     Head Circumference --      Peak Flow --      Pain Score 09/13/20 2005 0     Pain Loc --      Pain Edu? --      Excl. in GC? --     Constitutional: Alert and oriented. Eyes: Conjunctivae are normal. Head: Atraumatic. Nose: No congestion/rhinnorhea. Mouth/Throat: Mucous membranes are moist. Neck: Normal ROM Cardiovascular: Normal rate, regular rhythm. Grossly normal heart sounds. Respiratory: Normal respiratory effort.  No retractions. Lungs CTAB. Gastrointestinal: Soft and nontender. No distention. Genitourinary: deferred Musculoskeletal: No lower extremity tenderness nor edema. Neurologic:  Normal speech and language. No  gross focal neurologic deficits are appreciated. Skin:  Skin is warm, dry and intact. No rash noted. Psychiatric: Mood and affect are normal. Speech and behavior are normal.  ____________________________________________   LABS (all labs ordered are listed, but only abnormal results are displayed)  Labs Reviewed  COMPREHENSIVE METABOLIC PANEL - Abnormal; Notable for the following components:      Result Value   Potassium 3.2 (*)    All other components within normal limits  CBC - Abnormal; Notable for the following components:   RBC 5.27 (*)    All other  components within normal limits  RESP PANEL BY RT-PCR (FLU A&B, COVID) ARPGX2  ETHANOL  SALICYLATE LEVEL  ACETAMINOPHEN LEVEL  URINE DRUG SCREEN, QUALITATIVE ([facility] ONLY)  POC URINE PREG, ED    PROCEDURES  Procedure(s) performed (including Critical Care):  Procedures   ____________________________________________   INITIAL IMPRESSION / ASSESSMENT AND PLAN / ED COURSE       45 year old female with no significant past medical history presents to the ED with auditory hallucinations of a female voice saying that someone is coming after her.  She denies any suicidal or homicidal ideation, is calm and cooperative at this time and we will hold off on IVC.  Screening labs are unremarkable and patient denies any medical complaints, she may be medically cleared for psychiatric evaluation.  The patient has been placed in psychiatric observation due to the need to provide a safe environment for the patient while obtaining psychiatric consultation and evaluation, as well as ongoing medical and medication management to treat the patient's condition.  The patient has not been placed under full IVC at this time.       ____________________________________________   FINAL CLINICAL IMPRESSION(S) / ED DIAGNOSES  Final diagnoses:  Hallucinations     ED Discharge Orders    None       Note:  This document was prepared using Dragon voice recognition software and may include unintentional dictation errors.   Chesley Noon, MD 09/13/20 2044

## 2020-09-13 NOTE — ED Notes (Signed)
Pt up and to nurses desk, requests urine cup to provide urine sample. Pt seems anxious and this nurse questions what is wrong. Pt states she is concerned that someone is going to break into the unit and kill her. Pt questions if the area is safe, if the power goes out how will people be prevented to enter, if people can break in through the air ducts. This nurse assures safety and answers all of pt concerns. Pt enters restroom and provides sample. After pt uses restroom she again questions her safety. Pt then requests this nurse to call [employer] to go and perform a safety check on her daughter who is 24 at home due to fear that there is someone who is going to kill her and to have her brought to ED. This nurse questions pt on why she feels this way and pt is unable to explain. Pt attempts to states that her daughter is having same feelings as she is and the pt attempted to have her come here with her so she could be safe. Pt continues to be anxious and the more she speaks on situation, the more anxious she becomes and the more she questions if someone will kill her here. Pt states she cannot sleep due to fear of safety. This nurse asks pt if she would like any medications to assist with her anxiety, at first she denies but later states she would take something.   This nurse speaks to Annice Pih, NP about situation. Medications to follow, Annice Pih advised not to call [employer] to report these hallucinations and paranoia and to advise that her daughter is safe.

## 2020-09-13 NOTE — ED Notes (Addendum)
Pts belongings:  Manson Passey purse Black phone Keys Black jacket Chiropractor Black belt Black bra Black rainbow polka dot boots Pink panties Yellow colored earrings

## 2020-09-13 NOTE — Consult Note (Signed)
[facility] Face-to-Face Psychiatry Consult   Reason for Consult: Mental Health Problem Referring Physician: Dr. Larinda Buttery Patient Identification: Kimberly Burns MRN:  [id number] Principal Diagnosis: <principal problem not specified> Diagnosis:  Active Problems:   Auditory hallucinations   Anxiety disorder   Total Time spent with patient: 20 minutes  Subjective: " I am just stressed out. It's lots of family stuff."  Kimberly Burns is a 45 y.o. female patient presented to  [facility] ED via POV voluntarily due to the patient experiencing auditory hallucinations that someone threatens her, saying they will kill her. Per the ED triage nurse note, Pt states that she has been having auditory hallucinations for the past couple of days that someone is threatening her, saying they will kill her, and she is responsible for someone else's death. Pt denies anything like this happening in the past; pt states this all started when her sister told her someone was spreading rumors saying that she was dead. Pt denies SI/HI.  The patient was seen face-to-face by this provider; the chart was reviewed and consulted with Dr. Larinda Buttery on 09/13/2020 due to the patient's care. It was discussed with the EDP that the patient remained under observation overnight and will be reassessed in the a.m. to determine if she meets the criteria for psychiatric inpatient admission; she could be discharged back home.  During her assessment, she is alert and oriented x 4, paranoid, anxious but cooperative, and mood-congruent with affect. The patient continued to discuss that she is having intrusively taught by a female voice telling her to hurt herself. The patient does not appear to be responding to internal or external stimuli. The patient states this all started when her sister told her someone was spreading rumors saying she was dead. "Now I keep hearing voices that he will kill me." The patient is presenting with some delusional thinking. The  patient admits to auditory hallucinations but denies visual hallucinations. The patient denies any suicidal, homicidal, or self-harm ideations. The patient is presenting with some psychotic and paranoid behaviors. She is asking what is behind the garage door in her room? "Am I going to be safe here?" "Is anyone going to watch me so nobody can hurt me?"  During an encounter with the patient, she was able to answer questions appropriately but presented with increased anxiety and paranoia. Plan: The patient remained under observation overnight and will be reassessed in the a.m. to determine if she meets the criteria for psychiatric inpatient admission; she could be discharged back home.  HPI: Dr. Larinda Buttery; Kimberly Burns is a 45 y.o. female with no significant past medical history presents to the ED complaining of hallucinations.  Patient reports that over the past couple of weeks she has been hearing voices that have been increasingly intrusive.  She states that they say they are coming after her and that they are going to get her no matter what she does.  She describes it as a female voice but she denies any visual hallucinations.  She states she has scared for her safety and has no intention of harming herself or anyone else.  She denies any history of similar symptoms, denies any alcohol or drug use.  She denies any specific stressors in her life at this time.  Past Psychiatric History: No past psychiatric history  Risk to Self:  No Risk to Others:  No Prior Inpatient Therapy:  No Prior Outpatient Therapy:  No  Past Medical History: History reviewed. No pertinent past medical history.  Past Surgical History:  Procedure Laterality Date  . FOOT SURGERY     Family History: No family history on file. Family Psychiatric  History: No pertinent family psychiatric history Social History:  Social History   Substance and Sexual Activity  Alcohol Use Yes   Comment: Occasional     Social History    Substance and Sexual Activity  Drug Use No    Social History   Socioeconomic History  . Marital status: Single    Spouse name: Not on file  . Number of children: Not on file  . Years of education: Not on file  . Highest education level: Not on file  Occupational History  . Not on file  Tobacco Use  . Smoking status: Current Every Day Smoker    Packs/day: 0.50    Types: Cigarettes  . Smokeless tobacco: Never Used  Substance and Sexual Activity  . Alcohol use: Yes    Comment: Occasional  . Drug use: No  . Sexual activity: Yes  Other Topics Concern  . Not on file  Social History Narrative  . Not on file   Social Determinants of Health   Financial Resource Strain: Not on file  Food Insecurity: Not on file  Transportation Needs: Not on file  Physical Activity: Not on file  Stress: Not on file  Social Connections: Not on file   Additional Social History:    Allergies:  No Known Allergies  Labs:  Results for orders placed or performed during the hospital encounter of 09/13/20 (from the past 48 hour(s))  Comprehensive metabolic panel     Status: Abnormal   Collection Time: 09/13/20  8:10 PM  Result Value Ref Range   Sodium 141 135 - 145 mmol/L   Potassium 3.2 (L) 3.5 - 5.1 mmol/L   Chloride 107 98 - 111 mmol/L   CO2 22 22 - 32 mmol/L   Glucose, Bld 96 70 - 99 mg/dL    Comment: Glucose reference range applies only to samples taken after fasting for at least 8 hours.   BUN 11 6 - 20 mg/dL   Creatinine, Ser 5.27 0.44 - 1.00 mg/dL   Calcium 9.6 8.9 - 78.2 mg/dL   Total Protein 8.1 6.5 - 8.1 g/dL   Albumin 4.6 3.5 - 5.0 g/dL   AST 28 15 - 41 U/L   ALT 25 0 - 44 U/L   Alkaline Phosphatase 49 38 - 126 U/L   Total Bilirubin 1.0 0.3 - 1.2 mg/dL   GFR, Estimated >42 >35 mL/min    Comment: (NOTE) Calculated using the CKD-EPI Creatinine Equation (2021)    Anion gap 12 5 - 15    Comment: Performed at [facility], [address] [place], [place] [address]   Ethanol     Status: None   Collection Time: 09/13/20  8:10 PM  Result Value Ref Range   Alcohol, Ethyl (B) <10 <10 mg/dL    Comment: (NOTE) Lowest detectable limit for serum alcohol is 10 mg/dL.  For medical purposes only. Performed at [facility], [address] [place], [place] [address]   Salicylate level     Status: Abnormal   Collection Time: 09/13/20  8:10 PM  Result Value Ref Range   Salicylate Lvl <7.0 (L) 7.0 - 30.0 mg/dL    Comment: Performed at [facility], [address], [place], [place] [address]  Acetaminophen level     Status: Abnormal   Collection Time: 09/13/20  8:10 PM  Result  Value Ref Range   Acetaminophen (Tylenol), Serum <10 (L) 10 - 30 ug/mL    Comment: (NOTE) Therapeutic concentrations vary significantly. A range of 10-30 ug/mL  may be an effective concentration for many patients. However, some  are best treated at concentrations outside of this range. Acetaminophen concentrations >150 ug/mL at 4 hours after ingestion  and >50 ug/mL at 12 hours after ingestion are often associated with  toxic reactions.  Performed at [facility], [address] [place], [place] [address]   cbc     Status: Abnormal   Collection Time: 09/13/20  8:10 PM  Result Value Ref Range   WBC 8.7 4.0 - 10.5 K/uL   RBC 5.27 (H) 3.87 - 5.11 MIL/uL   Hemoglobin 14.2 12.0 - 15.0 g/dL   HCT 60.443.3 54.036.0 - 98.146.0 %   MCV 82.2 80.0 - 100.0 fL   MCH 26.9 26.0 - 34.0 pg   MCHC 32.8 30.0 - 36.0 g/dL   RDW 19.113.6 47.811.5 - 29.515.5 %   Platelets 158 150 - 400 K/uL   nRBC 0.0 0.0 - 0.2 %    Comment: Performed at [facility], [address] [place], [place] [address]    No current facility-administered medications for this encounter.   Current Outpatient Medications  Medication Sig Dispense Refill  . famotidine (PEPCID) 40 MG tablet Take 1 tablet (40 mg total) by mouth at bedtime. 30 tablet 1    Musculoskeletal: Strength & Muscle Tone: within  normal limits Gait & Station: normal Patient leans: N/A   Psychiatric Specialty Exam:  Presentation  General Appearance: Appropriate for Environment; Fairly Groomed  Eye Contact:Good  Speech:Clear and Coherent  Speech Volume:Normal  Handedness:Right   Mood and Affect  Mood:Anxious; Hopeless  Affect:Blunt; Congruent; Inappropriate   Thought Process  Thought Processes:Coherent  Descriptions of Associations:Intact  Orientation:Full (Time, Place and Person)  Thought Content:Abstract Reasoning; Obsessions; Paranoid Ideation  History of Schizophrenia/Schizoaffective disorder:No  Duration of Psychotic Symptoms:Less than six months  Hallucinations:Hallucinations: Auditory Description of Auditory Hallucinations: To hurt herself  Ideas of Reference:Paranoia  Suicidal Thoughts:Suicidal Thoughts: No  Homicidal Thoughts:Homicidal Thoughts: No   Sensorium  Memory:Immediate Good; Recent Good; Remote Good  Judgment:Fair  Insight:Fair   Executive Functions  Concentration:Fair  Attention Span:Fair  Recall:Fair  Fund of Knowledge:Fair  Language:Fair   Psychomotor Activity  Psychomotor Activity:Psychomotor Activity: Normal   Assets  Assets:Desire for Improvement; Resilience; Social Support   Sleep  Sleep:Sleep: Poor   Physical Exam: Physical Exam Vitals and nursing note reviewed.  Constitutional:      Appearance: Normal appearance. She is obese.  HENT:     Right Ear: External ear normal.     Left Ear: External ear normal.     Nose: Nose normal.     Mouth/Throat:     Mouth: Mucous membranes are moist.  Cardiovascular:     Rate and Rhythm: Normal rate.     Pulses: Normal pulses.  Pulmonary:     Effort: Pulmonary effort is normal.  Musculoskeletal:        General: Normal range of motion.     Cervical back: Normal range of motion.  Neurological:     General: No focal deficit present.     Mental Status: She is alert and oriented to person,  place, and time.  Psychiatric:        Attention and Perception: Attention and perception normal.        Mood and Affect: Mood is anxious and depressed. Affect is  inappropriate.        Speech: Speech normal.        Behavior: Behavior is cooperative.        Thought Content: Thought content is paranoid.        Cognition and Memory: Cognition and memory normal.        Judgment: Judgment is inappropriate.    Review of Systems  Psychiatric/Behavioral: Positive for depression and hallucinations. The patient is nervous/anxious and has insomnia.   All other systems reviewed and are negative.  Blood pressure 131/86, pulse 85, temperature 98.7 F (37.1 C), temperature source Oral, resp. rate 20, height 5\' 2"  (1.575 m), weight 79.4 kg, SpO2 95 %. Body mass index is 32.01 kg/m.  Treatment Plan Summary: Daily contact with patient to assess and evaluate symptoms and progress in treatment and The patient remained under observation overnight and will be reassessed in the a.m. to determine if she meets the criteria for psychiatric inpatient admission; she could be discharged back home.  Disposition: Supportive therapy provided about ongoing stressors. The patient remained under observation overnight and will be reassessed in the a.m. to determine if she meets the criteria for psychiatric inpatient admission; she could be discharged back home.  , NP 09/13/2020 9:49 PM

## 2020-09-13 NOTE — ED Notes (Signed)
Hourly rounding completed at this time, patient currently awake in room. No complaints, stable, and in no acute distress. Q15 minute rounds and monitoring via Security Cameras to continue. 

## 2020-09-13 NOTE — ED Notes (Signed)
Pt listed phone numbers in triage for pt to have if needed since she is not able to keep her phone at this time. Kimberly Burns [phone] Tab- [phone] [name] - [phone]

## 2020-09-13 NOTE — ED Triage Notes (Signed)
Pt states for the past couple of days she has been having auditory hallucinations that someone is threatening her saying they will kill her and she is responsible for someone else's death. Pt denies anything like this happening in past, pt states this all started when her sister told her someone was spreading rumors saying that she was dead. Pt denies SI/HI

## 2020-09-13 NOTE — ED Notes (Signed)
Patient transferred from ED to [facility] room 3 after screening for contraband. Report received from Penni Bombard, RN including Situation, Background, Assessment and Recommendations. Pt oriented to unit including Q15 minute rounds as well as the security cameras for their protection. Patient is alert and oriented, warm and dry in no acute distress. Patient denies SI, HI, and AVH. Pt. Encouraged to let this nurse know if needs arise.

## 2020-09-14 DIAGNOSIS — F23 Brief psychotic disorder: Secondary | ICD-10-CM

## 2020-09-14 LAB — LIPID PANEL
Cholesterol: 173 mg/dL (ref 0–200)
HDL: 35 mg/dL — ABNORMAL LOW (ref >40)
LDL Cholesterol: 118 mg/dL — ABNORMAL HIGH (ref 0–99)
Total CHOL/HDL Ratio: 4.9 RATIO
Triglycerides: 100 mg/dL (ref <150)
VLDL: 20 mg/dL (ref 0–40)

## 2020-09-14 LAB — URINE DRUG SCREEN, QUALITATIVE (ARMC ONLY)
Amphetamines, Ur Screen: NOT DETECTED
Barbiturates, Ur Screen: NOT DETECTED
Benzodiazepine, Ur Scrn: NOT DETECTED
Cannabinoid 50 Ng, Ur ~~LOC~~: POSITIVE — AB
Cocaine Metabolite,Ur ~~LOC~~: NOT DETECTED
MDMA (Ecstasy)Ur Screen: NOT DETECTED
Methadone Scn, Ur: NOT DETECTED
Opiate, Ur Screen: NOT DETECTED
Phencyclidine (PCP) Ur S: NOT DETECTED
Tricyclic, Ur Screen: NOT DETECTED

## 2020-09-14 LAB — HEMOGLOBIN A1C
Hgb A1c MFr Bld: 5.9 % — ABNORMAL HIGH (ref 4.8–5.6)
Mean Plasma Glucose: 122.63 mg/dL

## 2020-09-14 MED ORDER — RISPERIDONE 1 MG PO TABS: 0.5000 mg | ORAL_TABLET | Freq: Two times a day (BID) | ORAL | Status: DC

## 2020-09-14 NOTE — ED Notes (Signed)
Attempted to call report x 2 with no answer. 

## 2020-09-14 NOTE — ED Notes (Signed)
Pt asleep at this time, unable to collect vitals. Will collect pt vitals once awake. 

## 2020-09-14 NOTE — ED Notes (Signed)
Pt again states that she is concerned with her daughter and wants her daughter to come to ED due to not taking her medications and being bipolar and schizophrenic. Pt states that "If my daughter knew I was here she would come too to be with me and be seen since it is not safe at home." Pt continues to reference all these events back to her hallucinations she is experiencing at this time. Pt eating crackers in room now after taking her medications.

## 2020-09-14 NOTE — Consult Note (Signed)
[facility] Face-to-Face Psychiatry Consult   Reason for Consult: Consult for 45 year old woman who came to the emergency room reporting paranoia and hallucinations Referring Physician: Paduchowski Patient Identification: Kimberly Burns MRN:  [id number] Principal Diagnosis: Brief reactive psychosis (HCC) Diagnosis:  Principal Problem:   Brief reactive psychosis (HCC) Active Problems:   Auditory hallucinations   Anxiety disorder   Total Time spent with patient: 45 minutes  Subjective:   Kimberly Burns is a 45 y.o. female patient admitted with "I have just been feeling some kind of way".  HPI: Patient seen chart reviewed.  Patient came to the emergency room last night complaining of paranoia and auditory hallucinations.  Patient tells me essentially the same story reported yesterday that sometime within the last couple months she says that her sister told her that there had been a person who had been spreading a rumor that she, the patient, was dead.  Patient marks this as the beginning of all of her worry and anxiety.  Since then she has developed the idea that someone is trying to blame her for the death of other people or that some kind of malicious thing is going on.  Additionally she is having auditory hallucinations.  Hears voices and thousand coming out of the wall.  Feels like people are listening to her and spying on her.  Mood has been down nervous dysphoric.  Sleep has been impaired.  Appetite is been poor.  Patient denies suicidal ideation.  Denies homicidal ideation.  Not currently on any medication for anything and not getting any mental health treatment.  Claims she only drinks occasionally and does not use any other drugs.  Drug screen positive for cannabis.  Patient lives with her daughter and grandchild.  Not currently working.  Evasive on questions about her work situation saying that she had left her last job to take care of her daughter but now she has constant financial worries which  are a major stress.  Past Psychiatric History: No previous psychiatric history reported or documented.  No history of medications treatment hospitalizations self injury or violence.  Risk to Self:   Risk to Others:   Prior Inpatient Therapy:   Prior Outpatient Therapy:    Past Medical History: History reviewed. No pertinent past medical history.  Past Surgical History:  Procedure Laterality Date  . FOOT SURGERY     Family History: No family history on file. Family Psychiatric  History: Denies knowing of any Social History:  Social History   Substance and Sexual Activity  Alcohol Use Yes   Comment: Occasional     Social History   Substance and Sexual Activity  Drug Use No    Social History   Socioeconomic History  . Marital status: Single    Spouse name: Not on file  . Number of children: Not on file  . Years of education: Not on file  . Highest education level: Not on file  Occupational History  . Not on file  Tobacco Use  . Smoking status: Current Every Day Smoker    Packs/day: 0.50    Types: Cigarettes  . Smokeless tobacco: Never Used  Substance and Sexual Activity  . Alcohol use: Yes    Comment: Occasional  . Drug use: No  . Sexual activity: Yes  Other Topics Concern  . Not on file  Social History Narrative  . Not on file   Social Determinants of Health   Financial Resource Strain: Not on file  Food Insecurity: Not on  file  Transportation Needs: Not on file  Physical Activity: Not on file  Stress: Not on file  Social Connections: Not on file   Additional Social History:    Allergies:  No Known Allergies  Labs:  Results for orders placed or performed during the hospital encounter of 09/13/20 (from the past 48 hour(s))  Comprehensive metabolic panel     Status: Abnormal   Collection Time: 09/13/20  8:10 PM  Result Value Ref Range   Sodium 141 135 - 145 mmol/L   Potassium 3.2 (L) 3.5 - 5.1 mmol/L   Chloride 107 98 - 111 mmol/L   CO2 22 22 -  32 mmol/L   Glucose, Bld 96 70 - 99 mg/dL    Comment: Glucose reference range applies only to samples taken after fasting for at least 8 hours.   BUN 11 6 - 20 mg/dL   Creatinine, Ser 1.610.78 0.44 - 1.00 mg/dL   Calcium 9.6 8.9 - 09.610.3 mg/dL   Total Protein 8.1 6.5 - 8.1 g/dL   Albumin 4.6 3.5 - 5.0 g/dL   AST 28 15 - 41 U/L   ALT 25 0 - 44 U/L   Alkaline Phosphatase 49 38 - 126 U/L   Total Bilirubin 1.0 0.3 - 1.2 mg/dL   GFR, Estimated >04>60 >54>60 mL/min    Comment: (NOTE) Calculated using the CKD-EPI Creatinine Equation (2021)    Anion gap 12 5 - 15    Comment: Performed at [facility], [address] [place], [place] [address]  Ethanol     Status: None   Collection Time: 09/13/20  8:10 PM  Result Value Ref Range   Alcohol, Ethyl (B) <10 <10 mg/dL    Comment: (NOTE) Lowest detectable limit for serum alcohol is 10 mg/dL.  For medical purposes only. Performed at [facility], [address] [place], [place] [address]   Salicylate level     Status: Abnormal   Collection Time: 09/13/20  8:10 PM  Result Value Ref Range   Salicylate Lvl <7.0 (L) 7.0 - 30.0 mg/dL    Comment: Performed at [facility], [address] [place], [place] [address]  Acetaminophen level     Status: Abnormal   Collection Time: 09/13/20  8:10 PM  Result Value Ref Range   Acetaminophen (Tylenol), Serum <10 (L) 10 - 30 ug/mL    Comment: (NOTE) Therapeutic concentrations vary significantly. A range of 10-30 ug/mL  may be an effective concentration for many patients. However, some  are best treated at concentrations outside of this range. Acetaminophen concentrations >150 ug/mL at 4 hours after ingestion  and >50 ug/mL at 12 hours after ingestion are often associated with  toxic reactions.  Performed at [facility], [address] [place], [place] [address]   cbc     Status: Abnormal   Collection Time: 09/13/20  8:10 PM  Result Value Ref Range   WBC 8.7 4.0 -  10.5 K/uL   RBC 5.27 (H) 3.87 - 5.11 MIL/uL   Hemoglobin 14.2 12.0 - 15.0 g/dL   HCT 57.843.3 46.936.0 - 62.946.0 %   MCV 82.2 80.0 - 100.0 fL   MCH 26.9 26.0 - 34.0 pg   MCHC 32.8 30.0 - 36.0 g/dL   RDW 52.813.6 41.311.5 - 24.415.5 %   Platelets 158 150 - 400 K/uL   nRBC 0.0 0.0 - 0.2 %    Comment: Performed at [facility], [address] [place], [place] [address]  Urine Drug  Screen, Qualitative     Status: Abnormal   Collection Time: 09/13/20  9:10 PM  Result Value Ref Range   Tricyclic, Ur Screen NONE DETECTED NONE DETECTED   Amphetamines, Ur Screen NONE DETECTED NONE DETECTED   MDMA (Ecstasy)Ur Screen NONE DETECTED NONE DETECTED   Cocaine Metabolite,Ur [place] NONE DETECTED NONE DETECTED   Opiate, Ur Screen NONE DETECTED NONE DETECTED   Phencyclidine (PCP) Ur S NONE DETECTED NONE DETECTED   Cannabinoid 50 Ng, Ur [place] POSITIVE (A) NONE DETECTED   Barbiturates, Ur Screen NONE DETECTED NONE DETECTED   Benzodiazepine, Ur Scrn NONE DETECTED NONE DETECTED   Methadone Scn, Ur NONE DETECTED NONE DETECTED    Comment: (NOTE) Tricyclics + metabolites, urine    Cutoff 1000 ng/mL Amphetamines + metabolites, urine  Cutoff 1000 ng/mL MDMA (Ecstasy), urine              Cutoff 500 ng/mL Cocaine Metabolite, urine          Cutoff 300 ng/mL Opiate + metabolites, urine        Cutoff 300 ng/mL Phencyclidine (PCP), urine         Cutoff 25 ng/mL Cannabinoid, urine                 Cutoff 50 ng/mL Barbiturates + metabolites, urine  Cutoff 200 ng/mL Benzodiazepine, urine              Cutoff 200 ng/mL Methadone, urine                   Cutoff 300 ng/mL  The urine drug screen provides only a preliminary, unconfirmed analytical test result and should not be used for non-medical purposes. Clinical consideration and professional judgment should be applied to any positive drug screen result due to possible interfering substances. A more specific alternate chemical method must be used in order to obtain a confirmed  analytical result. Gas chromatography / mass spectrometry (GC/MS) is the preferred confirm atory method. Performed at [facility], [address] [place], [place] [address]   Resp Panel by RT-PCR (Flu A&B, Covid) Nasopharyngeal Swab     Status: None   Collection Time: 09/13/20  9:10 PM   Specimen: Nasopharyngeal Swab; Nasopharyngeal(NP) swabs in vial transport medium  Result Value Ref Range   SARS Coronavirus 2 by RT PCR NEGATIVE NEGATIVE    Comment: (NOTE) SARS-CoV-2 target nucleic acids are NOT DETECTED.  The SARS-CoV-2 RNA is generally detectable in upper respiratory specimens during the acute phase of infection. The lowest concentration of SARS-CoV-2 viral copies this assay can detect is 138 copies/mL. A negative result does not preclude SARS-Cov-2 infection and should not be used as the sole basis for treatment or other patient management decisions. A negative result may occur with  improper specimen collection/handling, submission of specimen other than nasopharyngeal swab, presence of viral mutation(s) within the areas targeted by this assay, and inadequate number of viral copies(<138 copies/mL). A negative result must be combined with clinical observations, patient history, and epidemiological information. The expected result is Negative.  Fact Sheet for Patients:  BloggerCourse.com  Fact Sheet for Healthcare Providers:  SeriousBroker.it  This test is no t yet approved or cleared by the [place] FDA and  has been authorized for detection and/or diagnosis of SARS-CoV-2 by FDA under an Emergency Use Authorization (EUA). This EUA will remain  in effect (meaning this test can be used) for the duration of the COVID-19 declaration under Section 564(b)(1) of the Act, 21 U.S.C.section  360bbb-3(b)(1), unless the authorization is terminated  or revoked sooner.       Influenza A by PCR NEGATIVE NEGATIVE    Influenza B by PCR NEGATIVE NEGATIVE    Comment: (NOTE) The Xpert Xpress SARS-CoV-2/FLU/RSV plus assay is intended as an aid in the diagnosis of influenza from Nasopharyngeal swab specimens and should not be used as a sole basis for treatment. Nasal washings and aspirates are unacceptable for Xpert Xpress SARS-CoV-2/FLU/RSV testing.  Fact Sheet for Patients: BloggerCourse.com  Fact Sheet for Healthcare Providers: SeriousBroker.it  This test is not yet approved or cleared by the [place] FDA and has been authorized for detection and/or diagnosis of SARS-CoV-2 by FDA under an Emergency Use Authorization (EUA). This EUA will remain in effect (meaning this test can be used) for the duration of the COVID-19 declaration under Section 564(b)(1) of the Act, 21 U.S.C. section 360bbb-3(b)(1), unless the authorization is terminated or revoked.  Performed at [facility], [address] [place], [place] [address]   Pregnancy, urine     Status: None   Collection Time: 09/13/20 11:16 PM  Result Value Ref Range   Preg Test, Ur NEGATIVE NEGATIVE    Comment: Performed at [facility], [address] [place], [place] [address]    Current Facility-Administered Medications  Medication Dose Route Frequency Provider Last Rate Last Admin  . OLANZapine (ZYPREXA) tablet 5 mg  5 mg Oral QHS Gillermo Murdoch, NP   5 mg at 09/14/20 0008   Current Outpatient Medications  Medication Sig Dispense Refill  . famotidine (PEPCID) 40 MG tablet Take 1 tablet (40 mg total) by mouth at bedtime. 30 tablet 1    Musculoskeletal: Strength & Muscle Tone: within normal limits Gait & Station: normal Patient leans: N/A            Psychiatric Specialty Exam:  Presentation  General Appearance: Appropriate for Environment; Fairly Groomed  Eye Contact:Good  Speech:Clear and Coherent  Speech  Volume:Normal  Handedness:Right   Mood and Affect  Mood:Anxious; Hopeless  Affect:Blunt; Congruent; Inappropriate   Thought Process  Thought Processes:Coherent  Descriptions of Associations:Intact  Orientation:Full (Time, Place and Person)  Thought Content:Abstract Reasoning; Obsessions; Paranoid Ideation  History of Schizophrenia/Schizoaffective disorder:No  Duration of Psychotic Symptoms:Less than six months  Hallucinations:Hallucinations: Auditory Description of Auditory Hallucinations: To hurt herself  Ideas of Reference:Paranoia  Suicidal Thoughts:Suicidal Thoughts: No  Homicidal Thoughts:Homicidal Thoughts: No   Sensorium  Memory:Immediate Good; Recent Good; Remote Good  Judgment:Fair  Insight:Fair   Executive Functions  Concentration:Fair  Attention Span:Fair  Recall:Fair  Fund of Knowledge:Fair  Language:Fair   Psychomotor Activity  Psychomotor Activity:Psychomotor Activity: Normal   Assets  Assets:Desire for Improvement; Resilience; Social Support   Sleep  Sleep:Sleep: Poor   Physical Exam: Physical Exam Vitals and nursing note reviewed.  Constitutional:      Appearance: Normal appearance.  HENT:     Head: Normocephalic and atraumatic.     Mouth/Throat:     Pharynx: Oropharynx is clear.  Eyes:     Pupils: Pupils are equal, round, and reactive to light.  Cardiovascular:     Rate and Rhythm: Normal rate and regular rhythm.  Pulmonary:     Effort: Pulmonary effort is normal.     Breath sounds: Normal breath sounds.  Abdominal:     General: Abdomen is flat.     Palpations: Abdomen is soft.  Musculoskeletal:        General: Normal range of motion.  Skin:    General:  Skin is warm and dry.  Neurological:     General: No focal deficit present.     Mental Status: She is alert. Mental status is at baseline.  Psychiatric:        Attention and Perception: She is inattentive. She perceives auditory hallucinations.        Mood  and Affect: Mood is anxious. Affect is flat.        Speech: Speech is tangential.        Behavior: Behavior is withdrawn.        Thought Content: Thought content is paranoid and delusional. Thought content does not include homicidal or suicidal ideation.        Cognition and Memory: Memory is impaired.        Judgment: Judgment is impulsive.    Review of Systems  Constitutional: Positive for malaise/fatigue.  HENT: Negative.   Eyes: Negative.   Respiratory: Negative.   Cardiovascular: Negative.   Gastrointestinal: Negative.   Musculoskeletal: Negative.   Skin: Negative.   Neurological: Negative.   Psychiatric/Behavioral: Positive for depression and hallucinations. Negative for substance abuse and suicidal ideas. The patient is nervous/anxious and has insomnia.    Blood pressure (!) 142/95, pulse 75, temperature 98.3 F (36.8 C), resp. rate 18, height  (1.575 m), weight 79.4 kg, SpO2 99 %. Body mass index is 32.01 kg/m.  Treatment Plan Summary: Medication management and Plan 45 year old woman with psychotic symptoms present for several weeks.  Dysphoric blunted affect.  Confusion.  Some symptoms that could be part of depression but no reported suicidal thoughts and no tearfulness and does not particularly feel like she is depressed.  Differential diagnosis includes brief reactive psychosis, major depression with psychosis, substance-induced psychosis by unknown agent and late onset schizophrenia.  Labs so far unremarkable.  Although she is not reporting any suicidal or homicidal thoughts it is generally good practice to treat for psychosis inpatient if possible.  Recommend starting low-dose Risperdal for psychosis.  Labs reviewed.  We do not have beds available on our unit right now and I will recommend she be referred out outside hospitals for possible admission while continuing observation in the ER.  Disposition: Recommend psychiatric Inpatient admission when medically  cleared. Supportive therapy provided about ongoing stressors.  Mordecai Rasmussen, MD 09/14/2020 11:19 AM

## 2020-09-14 NOTE — ED Notes (Signed)
Hourly rounding completed at this time, patient currently asleep in room. No complaints, stable, and in no acute distress. Q15 minute rounds and monitoring via Security Cameras to continue. 

## 2020-09-14 NOTE — BH Assessment (Signed)
Referral information for Psychiatric Hospitalization faxed to:  [facility] [phone])  [facility] [phone]- [phone]),   [address] [phone]),   [facility] [phone] -or- [phone]),   [facility] [phone] or [phone])  [facility] [phone] or [phone])  [facility] [place] ([phone]---[phone]),

## 2020-09-14 NOTE — ED Notes (Signed)
Hourly rounding completed at this time, patient currently awake in room. No complaints, stable, and in no acute distress. Q15 minute rounds and monitoring via Security Cameras to continue. 

## 2020-09-14 NOTE — ED Notes (Signed)
[place]  [facility]  [facility]  DEPT  CALLED  FOR TRANSPORT TO  [facility] 

## 2020-09-14 NOTE — BH Assessment (Signed)
PATIENT IS SCHEDULED FOR ADMISSION TODAY PENDING IVC   Patient has been accepted to [facility].  Patient assigned to: [facility]  Accepting physician is Dr. Forrestine Him  Call report to [phone] Representative was [facility]   ER Staff is aware of it:  Misty Stanley, ER Secretary  Lenard Lance, ER MD  Victorino Dike, Patient's Nurse  Family support system contact is pending pt's consent at this time

## 2020-09-14 NOTE — ED Notes (Signed)
Pt speaking with TTS regarding disposition and transfer to [facility].  Pt to call family after.

## 2020-09-14 NOTE — ED Notes (Signed)
VOL/Overnight observation and reassessment in AM.

## 2020-09-14 NOTE — ED Notes (Signed)
Report received, care of assumed.  Pt sleeping at this time, VS and assessment deferred until pt awake.

## 2020-09-14 NOTE — ED Notes (Signed)
Breakfast tray provided. 

## 2020-09-14 NOTE — ED Notes (Signed)
EMTALA reviewed by this RN.  

## 2020-09-14 NOTE — ED Notes (Signed)
Lunch tray provided. 

## 2020-09-14 NOTE — BH Assessment (Signed)
TTS spoke to pt by phone and provided her an update on her current disposition as well as IVC. Pt expressed to feel as if she is doing better and to only need to restart her medication with the plan to follow up with [facility]. Pt reports to understand the current disposition and agreed to follow up with Dr. Toni Amend regarding any current concerns. Pt expresses no further concerns and requested to make contact with family supports on her own at this time.

## 2023-01-07 ENCOUNTER — Other Ambulatory Visit: Payer: Self-pay

## 2023-01-07 ENCOUNTER — Emergency Department
Admission: EM | Admit: 2023-01-07 | Discharge: 2023-01-07 | Disposition: A | Discharge status: Home / Self Care | Payer: BLUE CROSS/BLUE SHIELD | Attending: Emergency Medicine | Admitting: Emergency Medicine

## 2023-01-07 DIAGNOSIS — X501XXA Overexertion from prolonged static or awkward postures, initial encounter: Secondary | ICD-10-CM | POA: Insufficient documentation

## 2023-01-07 DIAGNOSIS — M545 Low back pain, unspecified: Secondary | ICD-10-CM | POA: Insufficient documentation

## 2023-01-07 DIAGNOSIS — G8929 Other chronic pain: Secondary | ICD-10-CM

## 2023-01-07 DIAGNOSIS — R300 Dysuria: Secondary | ICD-10-CM | POA: Insufficient documentation

## 2023-01-07 DIAGNOSIS — M25561 Pain in right knee: Secondary | ICD-10-CM | POA: Insufficient documentation

## 2023-01-07 LAB — URINALYSIS, COMPLETE (UACMP) WITH MICROSCOPIC
Bilirubin Urine: NEGATIVE
Glucose, UA: NEGATIVE mg/dL
Hgb urine dipstick: NEGATIVE
Ketones, ur: NEGATIVE mg/dL
Leukocytes,Ua: NEGATIVE
Nitrite: NEGATIVE
Protein, ur: NEGATIVE mg/dL
Specific Gravity, Urine: 1.025 (ref 1.005–1.030)
pH: 5 (ref 5.0–8.0)

## 2023-01-07 MED ORDER — GABAPENTIN 100 MG PO CAPS: 100.0000 mg | ORAL_CAPSULE | Freq: Three times a day (TID) | ORAL | 0 refills | Status: DC

## 2023-01-07 NOTE — ED Triage Notes (Signed)
Pt to ED for lower back pain and bilateral knee pain, worse on R. Pt stumbled and twisted body yesterday, pain worse since then but ongoing  for months.

## 2023-01-07 NOTE — ED Provider Notes (Signed)
[facility] Provider Note    Event Date/Time   First MD Initiated Contact with Patient 01/07/23 1303     (approximate)  History   Chief Complaint: Back Pain and Knee Pain  HPI  Kimberly Burns is a 47 y.o. female with no significant past medical history who presents to the emergency department for right knee pain and left lower back pain.  According to the patient for the last few years she has been experiencing intermittent pain in the right knee she states several weeks ago she twisted it and since that time she has had some worsening pain with movement to the right knee.  She also states for the past week or 2 she has been experiencing some pain in her lower back mostly in her left lower back.  She states the pain is worse with moving especially bending or twisting.  However she also states she has had some slight dysuria over the same time.  Physical Exam   Triage Vital Signs: ED Triage Vitals  Encounter Vitals Group     BP 01/07/23 1152 131/82     Systolic BP Percentile --      Diastolic BP Percentile --      Pulse Rate 01/07/23 1152 87     Resp 01/07/23 1152 18     Temp 01/07/23 1152 98.2 F (36.8 C)     Temp Source 01/07/23 1152 Oral     SpO2 01/07/23 1152 100 %     Weight 01/07/23 1152 190 lb (86.2 kg)     Height 01/07/23 1152 5\' 2"  (1.575 m)     Head Circumference --      Peak Flow --      Pain Score 01/07/23 1156 8     Pain Loc --      Pain Education --      Exclude from Growth Chart --     Most recent vital signs: Vitals:   01/07/23 1152  BP: 131/82  Pulse: 87  Resp: 18  Temp: 98.2 F (36.8 C)  SpO2: 100%    General: Awake, no distress.  CV:  Good peripheral perfusion.  Regular rate and rhythm  Resp:  Normal effort.  Equal breath sounds bilaterally.  Abd:  No distention.  Soft, nontender.  No rebound or guarding. Other:  Good range of motion in the right knee.  No tenderness to palpation.  Patient does have mild midline and  left SI tenderness to the lower lumbar spine.  No abdominal tenderness.   ED Results / Procedures / Treatments   MEDICATIONS ORDERED IN ED: Medications - No data to display   IMPRESSION / MDM / ASSESSMENT AND PLAN / ED COURSE  I reviewed the triage vital signs and the nursing notes.  Patient's presentation is most consistent with acute illness / injury with system symptoms.  Patient presents emergency department with symptoms of right knee pain.  States has been intermittent over the last few months or longer but worse over the last several weeks.  Reassuring physical exam.  Given the patient's description of her injury with a twisting type discomfort I discussed with the patient the possibility is a possible meniscal tear or ligamentous injury.  Will refer to orthopedics for further evaluation but no acute issue present.  Patient does state several weeks of lower back pain intermittently mostly to the left side.  She describes it more as a tingling sensation and sharp pain upon bending or twisting.  Symptoms  suggest neuropathic pain.  However given the patient's report of dysuria recently as well we will check a urinalysis as a precaution although no CVA tenderness and a benign abdomen.  If the urinalysis does not show any significant finding I believe a trial of gabapentin for neuropathic pain will be warranted and have the patient follow-up again with orthopedics.  Patient agreeable to plan of care.  Urinalysis shows no concerning findings.  Will discharge a short course of gabapentin and have the patient follow-up with orthopedics.  Patient agreeable to plan.  FINAL CLINICAL IMPRESSION(S) / ED DIAGNOSES   Right knee pain Lower back pain  Rx / DC Orders   Gabapentin  Note:  This document was prepared using Dragon voice recognition software and may include unintentional dictation errors.   Minna Antis, MD 01/07/23 [phone]

## 2023-04-20 ENCOUNTER — Telehealth: Payer: BLUE CROSS/BLUE SHIELD | Admitting: Physician Assistant

## 2023-04-20 ENCOUNTER — Ambulatory Visit: Payer: Self-pay

## 2023-04-20 DIAGNOSIS — J069 Acute upper respiratory infection, unspecified: Secondary | ICD-10-CM

## 2023-04-20 DIAGNOSIS — B9689 Other specified bacterial agents as the cause of diseases classified elsewhere: Secondary | ICD-10-CM

## 2023-04-20 MED ORDER — PROMETHAZINE-DM 6.25-15 MG/5ML PO SYRP: 5.0000 mL | ORAL_SOLUTION | Freq: Four times a day (QID) | ORAL | 0 refills | Status: DC | PRN

## 2023-04-20 MED ORDER — AMOXICILLIN-POT CLAVULANATE 875-125 MG PO TABS: 1.0000 | ORAL_TABLET | Freq: Two times a day (BID) | ORAL | 0 refills | Status: DC

## 2023-04-20 MED ORDER — ALBUTEROL SULFATE HFA 108 (90 BASE) MCG/ACT IN AERS: 1.0000 | INHALATION_SPRAY | Freq: Four times a day (QID) | RESPIRATORY_TRACT | 0 refills | Status: DC | PRN

## 2023-04-20 MED ORDER — PREDNISONE 20 MG PO TABS: 40.0000 mg | ORAL_TABLET | Freq: Every day | ORAL | 0 refills | Status: DC

## 2023-04-20 NOTE — Patient Instructions (Signed)
Mauri Reading, thank you for joining Margaretann Loveless, PA-C for today's virtual visit.  While this provider is not your primary care provider (PCP), if your PCP is located in our provider database this encounter information will be shared with them immediately following your visit.   A [facility] MyChart account gives you access to today's visit and all your visits, tests, and labs performed at [facility] " click here if you don't have a [facility] MyChart account or go to [email or web address]  Consent: (Patient) Kimberly Burns provided verbal consent for this virtual visit at the beginning of the encounter.  Current Medications:  Current Outpatient Medications:    albuterol (VENTOLIN HFA) 108 (90 Base) MCG/ACT inhaler, Inhale 1-2 puffs into the lungs every 6 (six) hours as needed., Disp: 8 g, Rfl: 0   amoxicillin-clavulanate (AUGMENTIN) 875-125 MG tablet, Take 1 tablet by mouth 2 (two) times daily., Disp: 20 tablet, Rfl: 0   predniSONE (DELTASONE) 20 MG tablet, Take 2 tablets (40 mg total) by mouth daily with breakfast., Disp: 10 tablet, Rfl: 0   promethazine-dextromethorphan (PROMETHAZINE-DM) 6.25-15 MG/5ML syrup, Take 5 mLs by mouth 4 (four) times daily as needed., Disp: 118 mL, Rfl: 0   famotidine (PEPCID) 40 MG tablet, Take 1 tablet (40 mg total) by mouth at bedtime., Disp: 30 tablet, Rfl: 1   gabapentin (NEURONTIN) 100 MG capsule, Take 1 capsule (100 mg total) by mouth 3 (three) times daily., Disp: 90 capsule, Rfl: 0   Medications ordered in this encounter:  Meds ordered this encounter  Medications   amoxicillin-clavulanate (AUGMENTIN) 875-125 MG tablet    Sig: Take 1 tablet by mouth 2 (two) times daily.    Dispense:  20 tablet    Refill:  0    Order Specific Question:   Supervising Provider    Answer:   Merrilee Jansky [[id number]]   predniSONE (DELTASONE) 20 MG tablet    Sig: Take 2 tablets (40 mg total) by mouth daily with breakfast.    Dispense:  10  tablet    Refill:  0    Order Specific Question:   Supervising Provider    Answer:   Merrilee Jansky [[id number]]   albuterol (VENTOLIN HFA) 108 (90 Base) MCG/ACT inhaler    Sig: Inhale 1-2 puffs into the lungs every 6 (six) hours as needed.    Dispense:  8 g    Refill:  0    Order Specific Question:   Supervising Provider    Answer:   Merrilee Jansky [id number]   promethazine-dextromethorphan (PROMETHAZINE-DM) 6.25-15 MG/5ML syrup    Sig: Take 5 mLs by mouth 4 (four) times daily as needed.    Dispense:  118 mL    Refill:  0    Order Specific Question:   Supervising Provider    Answer:   Merrilee Jansky [[id number]]     *If you need refills on other medications prior to your next appointment, please contact your pharmacy*  Follow-Up: Call back or seek an in-person evaluation if the symptoms worsen or if the condition fails to improve as anticipated.  [facility] [phone]  Other Instructions Acute Bronchitis, Adult  Acute bronchitis is sudden inflammation of the main airways (bronchi) that come off the windpipe (trachea) in the lungs. The swelling causes the airways to get smaller and make more mucus than normal. This can make it hard to breathe and can cause coughing or noisy breathing (wheezing). Acute bronchitis may last several  weeks. The cough may last longer. Allergies, asthma, and exposure to smoke may make the condition worse. What are the causes? This condition can be caused by germs and by substances that irritate the lungs, including: Cold and flu viruses. The most common cause of this condition is the virus that causes the common cold. Bacteria. This is less common. Breathing in substances that irritate the lungs, including: Smoke from cigarettes and other forms of tobacco. Dust and pollen. Fumes from household cleaning products, gases, or burned fuel. Indoor or outdoor air pollution. What increases the risk? The following factors may make you more  likely to develop this condition: A weak body's defense system, also called the immune system. A condition that affects your lungs and breathing, such as asthma. What are the signs or symptoms? Common symptoms of this condition include: Coughing. This may bring up clear, yellow, or green mucus from your lungs (sputum). Wheezing. Runny or stuffy nose. Having too much mucus in your lungs (chest congestion). Shortness of breath. Aches and pains, including sore throat or chest. How is this diagnosed? This condition is usually diagnosed based on: Your symptoms and medical history. A physical exam. You may also have other tests, including tests to rule out other conditions, such as pneumonia. These tests include: A test of lung function. Test of a mucus sample to look for the presence of bacteria. Tests to check the oxygen level in your blood. Blood tests. Chest X-ray. How is this treated? Most cases of acute bronchitis clear up over time without treatment. Your health care provider may recommend: Drinking more fluids to help thin your mucus so it is easier to cough up. Taking inhaled medicine (inhaler) to improve air flow in and out of your lungs. Using a vaporizer or a humidifier. These are machines that add water to the air to help you breathe better. Taking a medicine that thins mucus and clears congestion (expectorant). Taking a medicine that prevents or stops coughing (cough suppressant). It is not common to take an antibiotic medicine for this condition. Follow these instructions at home:  Take over-the-counter and prescription medicines only as told by your health care provider. Use an inhaler, vaporizer, or humidifier as told by your health care provider. Take two teaspoons (10 mL) of honey at bedtime to lessen coughing at night. Drink enough fluid to keep your urine pale yellow. Do not use any products that contain nicotine or tobacco. These products include cigarettes, chewing  tobacco, and vaping devices, such as e-cigarettes. If you need help quitting, ask your health care provider. Get plenty of rest. Return to your normal activities as told by your health care provider. Ask your health care provider what activities are safe for you. Keep all follow-up visits. This is important. How is this prevented? To lower your risk of getting this condition again: Wash your hands often with soap and water for at least 20 seconds. If soap and water are not available, use hand sanitizer. Avoid contact with people who have cold symptoms. Try not to touch your mouth, nose, or eyes with your hands. Avoid breathing in smoke or chemical fumes. Breathing smoke or chemical fumes will make your condition worse. Get the flu shot every year. Contact a health care provider if: Your symptoms do not improve after 2 weeks. You have trouble coughing up the mucus. Your cough keeps you awake at night. You have a fever. Get help right away if you: Cough up blood. Feel pain in your chest. Have  severe shortness of breath. Faint or keep feeling like you are going to faint. Have a severe headache. Have a fever or chills that get worse. These symptoms may represent a serious problem that is an emergency. Do not wait to see if the symptoms will go away. Get medical help right away. Call your local emergency services (911 in the [place] Do not drive yourself to the hospital. Summary Acute bronchitis is inflammation of the main airways (bronchi) that come off the windpipe (trachea) in the lungs. The swelling causes the airways to get smaller and make more mucus than normal. Drinking more fluids can help thin your mucus so it is easier to cough up. Take over-the-counter and prescription medicines only as told by your health care provider. Do not use any products that contain nicotine or tobacco. These products include cigarettes, chewing tobacco, and vaping devices, such as e-cigarettes. If you need  help quitting, ask your health care provider. Contact a health care provider if your symptoms do not improve after 2 weeks. This information is not intended to replace advice given to you by your health care provider. Make sure you discuss any questions you have with your health care provider. Document Revised: 09/12/2021 Document Reviewed: 10/03/2020 Elsevier Patient Education  2024 [employer]vier [employer].    If you have been instructed to have an in-person evaluation today at a local Urgent Care facility, please use the link below. It will take you to a list of all of our available [facility], including address, phone number and hours of operation. Please do not delay care.  [facility]  If you or a family member do not have a primary care provider, use the link below to schedule a visit and establish care. When you choose a [facility] primary care physician or advanced practice provider, you gain a long-term partner in health. Find a Primary Care Provider  Learn more about [facility]'s in-office and virtual care options: [facility] - Get Care Now

## 2023-04-20 NOTE — Progress Notes (Signed)
Virtual Visit Consent   Kimberly Burns, you are scheduled for a virtual visit with a [facility] provider today. Just as with appointments in the office, your consent must be obtained to participate. Your consent will be active for this visit and any virtual visit you may have with one of our providers in the next 365 days. If you have a MyChart account, a copy of this consent can be sent to you electronically.  As this is a virtual visit, video technology does not allow for your provider to perform a traditional examination. This may limit your provider's ability to fully assess your condition. If your provider identifies any concerns that need to be evaluated in person or the need to arrange testing (such as labs, EKG, etc.), we will make arrangements to do so. Although advances in technology are sophisticated, we cannot ensure that it will always work on either your end or our end. If the connection with a video visit is poor, the visit may have to be switched to a telephone visit. With either a video or telephone visit, we are not always able to ensure that we have a secure connection.  By engaging in this virtual visit, you consent to the provision of healthcare and authorize for your insurance to be billed (if applicable) for the services provided during this visit. Depending on your insurance coverage, you may receive a charge related to this service.  I need to obtain your verbal consent now. Are you willing to proceed with your visit today? Kimberly Burns has provided verbal consent on 04/20/2023 for a virtual visit (video or telephone). Margaretann Loveless, PA-C  Date: 04/20/2023 5:21 PM  Virtual Visit via Video Note   I, Margaretann Loveless, connected with  Kimberly Burns  ([id number], 49-04-1976) on 04/20/23 at  5:15 PM EST by a video-enabled telemedicine application and verified that I am speaking with the correct person using two identifiers.  Location: Patient: Virtual Visit Location  Patient: Home Provider: Virtual Visit Location Provider: Home Office   I discussed the limitations of evaluation and management by telemedicine and the availability of in person appointments. The patient expressed understanding and agreed to proceed.    History of Present Illness: Kimberly Burns is a 47 y.o. who identifies as a female who was assigned female at birth, and is being seen today for URI symptoms.  HPI: URI  This is a new problem. The current episode started 1 to 4 weeks ago. The problem has been gradually worsening. There has been no fever. Associated symptoms include congestion, coughing (productive), headaches, nausea (from coughing up mucus that is thick), a plugged ear sensation (bilateral, R>L), rhinorrhea (post nasal drainage), sinus pain, a sore throat and wheezing. Pertinent negatives include no diarrhea, ear pain or vomiting. Associated symptoms comments: Myalgias, watery eyes, chills. She has tried antihistamine (honey robitussin, mucinex, delsym, hot water with lemon, salt water gargles) for the symptoms. The treatment provided no relief.     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   Anxiety disorder 09/13/2020    Allergies: No Known Allergies Medications:  Current Outpatient Medications:    albuterol (VENTOLIN HFA) 108 (90 Base) MCG/ACT inhaler, Inhale 1-2 puffs into the lungs every 6 (six) hours as needed., Disp: 8 g, Rfl: 0   amoxicillin-clavulanate (AUGMENTIN) 875-125 MG tablet, Take 1 tablet by mouth 2 (two) times daily., Disp: 20  tablet, Rfl: 0   predniSONE (DELTASONE) 20 MG tablet, Take 2 tablets (40 mg total) by mouth daily with breakfast., Disp: 10 tablet, Rfl: 0   promethazine-dextromethorphan (PROMETHAZINE-DM) 6.25-15 MG/5ML syrup, Take 5 mLs by mouth 4 (four) times daily as needed., Disp: 118 mL, Rfl: 0   famotidine (PEPCID) 40 MG tablet, Take 1 tablet  (40 mg total) by mouth at bedtime., Disp: 30 tablet, Rfl: 1   gabapentin (NEURONTIN) 100 MG capsule, Take 1 capsule (100 mg total) by mouth 3 (three) times daily., Disp: 90 capsule, Rfl: 0  Observations/Objective: Patient is well-developed, well-nourished in no acute distress.  Resting comfortably at home.  Head is normocephalic, atraumatic.  No labored breathing.  Speech is clear and coherent with logical content.  Patient is alert and oriented at baseline.    Assessment and Plan: 1. Bacterial upper respiratory infection - amoxicillin-clavulanate (AUGMENTIN) 875-125 MG tablet; Take 1 tablet by mouth 2 (two) times daily.  Dispense: 20 tablet; Refill: 0 - predniSONE (DELTASONE) 20 MG tablet; Take 2 tablets (40 mg total) by mouth daily with breakfast.  Dispense: 10 tablet; Refill: 0 - albuterol (VENTOLIN HFA) 108 (90 Base) MCG/ACT inhaler; Inhale 1-2 puffs into the lungs every 6 (six) hours as needed.  Dispense: 8 g; Refill: 0 - promethazine-dextromethorphan (PROMETHAZINE-DM) 6.25-15 MG/5ML syrup; Take 5 mLs by mouth 4 (four) times daily as needed.  Dispense: 118 mL; Refill: 0  - Worsening over a week despite OTC medications - Will treat with Z-pack, Prednisone, Albuterol, and Promethazine DM - Can continue Mucinex  - Push fluids.  - Rest.  - Steam and humidifier can help - Seek in person evaluation if worsening or symptoms fail to improve    Follow Up Instructions: I discussed the assessment and treatment plan with the patient. The patient was provided an opportunity to ask questions and all were answered. The patient agreed with the plan and demonstrated an understanding of the instructions.  A copy of instructions were sent to the patient via MyChart unless otherwise noted below.    The patient was advised to call back or seek an in-person evaluation if the symptoms worsen or if the condition fails to improve as anticipated.    Margaretann Loveless, PA-C

## 2023-04-20 NOTE — Telephone Encounter (Signed)
  Chief Complaint: URI - cough green phlegm, Some difficulty breathing Symptoms: above Frequency: Thursday Pertinent Negatives: Patient denies  Disposition: [] ED /[] Urgent Care (no appt availability in office) / [] Appointment(In office/virtual)/ [x]  [facility]/ [] Home Care/ [] Refused Recommended Disposition /[] [facility]/ []  Follow-up with PCP Additional Notes: Pt has an URI, with cough, and has had some breathing issues. Pt would like to be seen by provider for medications. Pt scheduled for a VV.     Summary: coughing + body sore + cold symptoms   Patient called in to the [facility] line, thinking she was getting a walk in clinic in [place]. She has some coughing, ears popping, phlegm too, sore throat and her tongue is sore, also her body is sore, no fever. Patient has a PCP but has not contacted them for not feeling well, she said a walk in clinic in [place] is closer for her. Patient states it has been going on since Friday and she has been taking OTC medications. Please advise. Patients callback #[phone]         Reason for Disposition  Common cold with no complications  Answer Assessment - Initial Assessment Questions 1. ONSET: "When did the nasal discharge start?"      Thursday  - started 2. AMOUNT: "How much discharge is there?"      A lot 3. COUGH: "Do you have a cough?" If Yes, ask: "Describe the color of your sputum" (clear, white, yellow, green)     Green  4. RESPIRATORY DISTRESS: "Describe your breathing."      Heavy breathing 5. FEVER: "Do you have a fever?" If Yes, ask: "What is your temperature, how was it measured, and when did it start?"     no 6. SEVERITY: "Overall, how bad are you feeling right now?" (e.g., doesn't interfere with normal activities, staying home from school/work, staying in bed)      8/10 7. OTHER SYMPTOMS: "Do you have any other symptoms?" (e.g., sore throat, earache, wheezing, vomiting)     Sore  Protocols used: Common  Cold-A-AH

## 2023-12-31 ENCOUNTER — Ambulatory Visit (INDEPENDENT_AMBULATORY_CARE_PROVIDER_SITE_OTHER): Payer: MEDICAID | Admitting: Physician Assistant

## 2023-12-31 DIAGNOSIS — M25562 Pain in left knee: Secondary | ICD-10-CM

## 2023-12-31 DIAGNOSIS — M25561 Pain in right knee: Secondary | ICD-10-CM

## 2023-12-31 DIAGNOSIS — K219 Gastro-esophageal reflux disease without esophagitis: Secondary | ICD-10-CM

## 2023-12-31 DIAGNOSIS — K1370 Unspecified lesions of oral mucosa: Secondary | ICD-10-CM

## 2023-12-31 DIAGNOSIS — G8929 Other chronic pain: Secondary | ICD-10-CM

## 2023-12-31 DIAGNOSIS — Z7689 Persons encountering health services in other specified circumstances: Secondary | ICD-10-CM

## 2023-12-31 DIAGNOSIS — B9689 Other specified bacterial agents as the cause of diseases classified elsewhere: Secondary | ICD-10-CM

## 2023-12-31 DIAGNOSIS — F17210 Nicotine dependence, cigarettes, uncomplicated: Secondary | ICD-10-CM

## 2023-12-31 DIAGNOSIS — F39 Unspecified mood [affective] disorder: Secondary | ICD-10-CM

## 2023-12-31 DIAGNOSIS — R0989 Other specified symptoms and signs involving the circulatory and respiratory systems: Secondary | ICD-10-CM | POA: Diagnosis not present

## 2023-12-31 MED ORDER — NYSTATIN 100000 UNIT/ML MT SUSP: 5.0000 mL | Freq: Four times a day (QID) | OROMUCOSAL | 0 refills | Status: DC

## 2023-12-31 NOTE — Progress Notes (Signed)
 Established patient visit  Patient: Kimberly Burns   DOB: Jan 05, 1976   47 y.o. Female  MRN: [id number] Visit Date: 12/31/2023  Today's healthcare provider: Jolynn Spencer, PA-C   Chief Complaint  Patient presents with   Transitions Of Care    TOC/NP.SABRA Pt wants to talk about knee pain and overall health check. And wt loss ( possible surgery)   Subjective     HPI     Transitions Of Care    Additional comments: TOC/NP.SABRA Pt wants to talk about knee pain and overall health check. And wt loss ( possible surgery)      Last edited by Marylen Odella LITTIE, CMA on 12/31/2023  2:26 PM.       Discussed the use of AI scribe software for clinical note transcription with the patient, who gave verbal consent to proceed.  History of Present Illness Kimberly Burns is a 48 year old female who presents for a comprehensive physical examination and evaluation of multiple symptoms.  She experiences urinary symptoms, including a 'strong acid' and 'fishy odor' in her urine, with occasional cramps and side pain. Previous evaluation ruled out a urinary tract infection.  She is overweight and desires weight loss. Her weight has been stable for the past year or two. She has knee pain, previously treated with cortisone injections, but insurance issues prevented gel shots.  She smokes cigarettes, starting at age 11, and currently smokes a pack every two to three days. She wants to quit smoking. She experiences wheezing and shortness of breath, using an albuterol  inhaler as needed. Her sister had COPD.  She has ocular symptoms, including waking with crust in her eyes, and throat symptoms, such as phlegm, post-nasal drainage, and congestion.  She has not had a Pap smear in over two years and is concerned due to a family history of cancer, with her sister having passed away from mediastinal cancer.  She experiences oral symptoms, including pain and white spots on her tongue, which she describes as moving from one  side to the other.  She has gastrointestinal symptoms, including morning acid reflux and describes her saliva as having a 'nasty odor.'  She experiences occasional sharp chest pains without consistent chest pain, rapid heartbeats, or significant shortness of breath. No issues with bowel movements or leg swelling.       12/31/2023    3:07 PM  Depression screen PHQ 2/9  Decreased Interest 2  Down, Depressed, Hopeless 2  PHQ - 2 Score 4  Altered sleeping 3  Tired, decreased energy 3  Change in appetite 2  Feeling bad or failure about yourself  3  Trouble concentrating 0  Suicidal thoughts 0  PHQ-9 Score 15  Difficult doing work/chores Not difficult at all      12/31/2023    3:07 PM  GAD 7 : Generalized Anxiety Score  Nervous, Anxious, on Edge 0  Control/stop worrying 0  Worry too much - different things 3  Trouble relaxing 0  Restless 0  Easily annoyed or irritable 1  Afraid - awful might happen 0  Total GAD 7 Score 4  Anxiety Difficulty Not difficult at all    Medications: Outpatient Medications Prior to Visit  Medication Sig   albuterol  (VENTOLIN  HFA) 108 (90 Base) MCG/ACT inhaler Inhale 1-2 puffs into the lungs every 6 (six) hours as needed.   amoxicillin -clavulanate (AUGMENTIN ) 875-125 MG tablet Take 1 tablet by mouth 2 (two) times daily.   famotidine  (PEPCID ) 40 MG tablet Take 1 tablet (  40 mg total) by mouth at bedtime.   gabapentin  (NEURONTIN ) 100 MG capsule Take 1 capsule (100 mg total) by mouth 3 (three) times daily.   predniSONE  (DELTASONE ) 20 MG tablet Take 2 tablets (40 mg total) by mouth daily with breakfast. (Patient not taking: Reported on 12/31/2023)   promethazine -dextromethorphan (PROMETHAZINE -DM) 6.25-15 MG/5ML syrup Take 5 mLs by mouth 4 (four) times daily as needed. (Patient not taking: Reported on 12/31/2023)   No facility-administered medications prior to visit.    Review of Systems All negative Except see HPI       Objective    BP 135/80 (BP  Location: Right Arm, Patient Position: Sitting, Cuff Size: Normal)   Pulse 81   Resp 16   Ht 5' 3 (1.6 m)   Wt 249 lb (112.9 kg)   SpO2 100%   BMI 44.11 kg/m     Physical Exam Vitals reviewed.  Constitutional:      General: She is not in acute distress.    Appearance: Normal appearance. She is well-developed. She is obese. She is not diaphoretic.  HENT:     Head: Normocephalic and atraumatic.  Eyes:     General: No scleral icterus.    Conjunctiva/sclera: Conjunctivae normal.  Neck:     Thyroid: No thyromegaly.  Cardiovascular:     Rate and Rhythm: Normal rate and regular rhythm.     Pulses: Normal pulses.     Heart sounds: Normal heart sounds. No murmur heard. Pulmonary:     Effort: Pulmonary effort is normal. No respiratory distress.     Breath sounds: Normal breath sounds. No wheezing, rhonchi or rales.  Musculoskeletal:     Cervical back: Neck supple.     Right lower leg: No edema.     Left lower leg: No edema.  Lymphadenopathy:     Cervical: No cervical adenopathy.  Skin:    General: Skin is warm and dry.     Findings: No rash.  Neurological:     Mental Status: She is alert and oriented to person, place, and time. Mental status is at baseline.  Psychiatric:        Mood and Affect: Mood normal.        Behavior: Behavior normal.      Results for orders placed or performed in visit on 12/31/23  Lipid panel  Result Value Ref Range   Cholesterol, Total 212 (H) 100 - 199 mg/dL   Triglycerides 884 0 - 149 mg/dL   HDL 42 >60 mg/dL   VLDL Cholesterol Cal 21 5 - 40 mg/dL   LDL Chol Calc (NIH) 850 (H) 0 - 99 mg/dL   Chol/HDL Ratio 5.0 (H) 0.0 - 4.4 ratio  CBC with Differential/Platelet  Result Value Ref Range   WBC 7.0 3.4 - 10.8 x10E3/uL   RBC 5.40 (H) 3.77 - 5.28 x10E6/uL   Hemoglobin 14.6 11.1 - 15.9 g/dL   Hematocrit 55.2 65.9 - 46.6 %   MCV 83 79 - 97 fL   MCH 27.0 26.6 - 33.0 pg   MCHC 32.7 31.5 - 35.7 g/dL   RDW 86.6 88.2 - 84.5 %   Platelets 148 (L)  150 - 450 x10E3/uL   Neutrophils 59 Not Estab. %   Lymphs 32 Not Estab. %   Monocytes 8 Not Estab. %   Eos 1 Not Estab. %   Basos 0 Not Estab. %   Neutrophils Absolute 4.0 1.4 - 7.0 x10E3/uL   Lymphocytes Absolute 2.3 0.7 - 3.1 x10E3/uL  Monocytes Absolute 0.6 0.1 - 0.9 x10E3/uL   EOS (ABSOLUTE) 0.1 0.0 - 0.4 x10E3/uL   Basophils Absolute 0.0 0.0 - 0.2 x10E3/uL   Immature Granulocytes 0 Not Estab. %   Immature Grans (Abs) 0.0 0.0 - 0.1 x10E3/uL  Comprehensive metabolic panel with GFR  Result Value Ref Range   Glucose 86 70 - 99 mg/dL   BUN 12 6 - 24 mg/dL   Creatinine, Ser 9.23 0.57 - 1.00 mg/dL   eGFR 96 >40 fO/fpw/8.26   BUN/Creatinine Ratio 16 9 - 23   Sodium 140 134 - 144 mmol/L   Potassium 4.3 3.5 - 5.2 mmol/L   Chloride 101 96 - 106 mmol/L   CO2 21 20 - 29 mmol/L   Calcium  9.9 8.7 - 10.2 mg/dL   Total Protein 7.4 6.0 - 8.5 g/dL   Albumin 4.5 3.9 - 4.9 g/dL   Globulin, Total 2.9 1.5 - 4.5 g/dL   Bilirubin Total 0.3 0.0 - 1.2 mg/dL   Alkaline Phosphatase 83 44 - 121 IU/L   AST 20 0 - 40 IU/L   ALT 25 0 - 32 IU/L  Hemoglobin A1c  Result Value Ref Range   Hgb A1c MFr Bld 6.3 (H) 4.8 - 5.6 %   Est. average glucose Bld gHb Est-mCnc 134 mg/dL  TSH  Result Value Ref Range   TSH 0.592 0.450 - 4.500 uIU/mL        Assessment & Plan  Knee Pain Chronic bilateral knee pain, previously treated with cortisone injections. Weight loss preferred for non-surgical management. - Recommend weight loss. - Suggest knee braces and heat application. - Prescribe NSAIDs such as meloxicam. - Encourage physical therapy if not contraindicated.  Respiratory Symptoms Occasional wheezing, previously prescribed albuterol , no asthma or COPD diagnosis. Suspected COPD vs Asthma diagnosis Will refill albuterol  Will reassess the need for pulmonology  Oral Ulcers Recurrent oral ulcers suggestive of stomatitis or infection, no recent dental evaluation. - Prescribe magic mouthwash. - Recommend  dental evaluation.  Gastroesophageal Reflux Disease (GERD) Symptoms consistent with GERD, no formal diagnosis. - Discuss dietary modifications.  Follow-up Follow-up needed for weight, respiratory symptoms, and oral health. - Schedule follow-up in six weeks for blood work review and management discussion. - Plan physical examination next month.  General Health Maintenance She has a long history of smoking, overdue Pap smear, overweight, and family cancer history. - Order standard blood work. - Refer to gynecologist for Pap smear. - Provided information on smoking cessation programs. - Refer to nutritionist for weight management. - Discussed importance of regular dental check-ups.  Encounter to establish care Welcomed to our clinic Reviewed past medical hx, social hx, family hx and surgical hx Pt advised to sign a release form for her old records Including her vaccination records  Continuous dependence on cigarette smoking Patient was advised to quit smoking  Will reassess at the next appt   Morbid obesity (HCC) (Primary) Chronic Body mass index is 44.11 kg/m. Weight loss of 5% of pt's current weight via healthy diet and daily exercise encouraged. ordered - Lipid panel - CBC with Differential/Platelet - Comprehensive metabolic panel with GFR - Hemoglobin A1c - TSH - Amb Ref to Medical Weight Management Will reassess after  receiving lab results  Mouth lesion - magic mouthwash (nystatin , lidocaine , diphenhydrAMINE, alum & mag hydroxide) suspension; Swish and spit 5 mLs 4 (four) times daily. Please, mix in equal ratio  Dispense: 180 mL; Refill: 0  Gastroesophageal reflux disease without esophagitis Chronic Stable Took famotidine  in the  past Lifestyle modifications advised Will follow-up  Mood disorder Depression and anxiety    12/31/2023    3:07 PM  PHQ9 SCORE ONLY  PHQ-9 Total Score 15      12/31/2023    3:07 PM  GAD 7 : Generalized Anxiety Score  Nervous,  Anxious, on Edge 0  Control/stop worrying 0  Worry too much - different things 3  Trouble relaxing 0  Restless 0  Easily annoyed or irritable 1  Afraid - awful might happen 0  Total GAD 7 Score 4  Anxiety Difficulty Not difficult at all   Not on medication Consider [facility] referral if pt agrees Will reassess at the follow-up  Orders Placed This Encounter  Procedures   Lipid panel    Has the patient fasted?:   Yes   CBC with Differential/Platelet   Comprehensive metabolic panel with GFR    Has the patient fasted?:   Yes   Hemoglobin A1c   TSH   Amb Ref to Medical Weight Management    Referral Priority:   Routine    Referral Type:   Consultation    Number of Visits Requested:   1    Return in about 6 weeks (around 02/11/2024) for CPE.   The patient was advised to call back or seek an in-person evaluation if the symptoms worsen or if the condition fails to improve as anticipated.  I discussed the assessment and treatment plan with the patient. The patient was provided an opportunity to ask questions and all were answered. The patient agreed with the plan and demonstrated an understanding of the instructions.  I, Shervon Kerwin, PA-C have reviewed all documentation for this visit. The documentation on 12/31/2023  for the exam, diagnosis, procedures, and orders are all accurate and complete.  Jolynn Spencer, [facility], MMS [facility] [phone] (phone) [phone] (fax)  [facility]

## 2024-01-01 ENCOUNTER — Other Ambulatory Visit: Payer: Self-pay

## 2024-01-01 ENCOUNTER — Ambulatory Visit: Payer: Self-pay | Admitting: Physician Assistant

## 2024-01-01 LAB — COMPREHENSIVE METABOLIC PANEL WITH GFR
ALT: 25 IU/L (ref 0–32)
AST: 20 IU/L (ref 0–40)
Albumin: 4.5 g/dL (ref 3.9–4.9)
Alkaline Phosphatase: 83 IU/L (ref 44–121)
BUN/Creatinine Ratio: 16 (ref 9–23)
BUN: 12 mg/dL (ref 6–24)
Bilirubin Total: 0.3 mg/dL (ref 0.0–1.2)
CO2: 21 mmol/L (ref 20–29)
Calcium: 9.9 mg/dL (ref 8.7–10.2)
Chloride: 101 mmol/L (ref 96–106)
Creatinine, Ser: 0.76 mg/dL (ref 0.57–1.00)
Globulin, Total: 2.9 g/dL (ref 1.5–4.5)
Glucose: 86 mg/dL (ref 70–99)
Potassium: 4.3 mmol/L (ref 3.5–5.2)
Sodium: 140 mmol/L (ref 134–144)
Total Protein: 7.4 g/dL (ref 6.0–8.5)
eGFR: 96 mL/min/1.73 (ref >59)

## 2024-01-01 LAB — CBC WITH DIFFERENTIAL/PLATELET
Basophils Absolute: 0 x10E3/uL (ref 0.0–0.2)
Basos: 0 %
EOS (ABSOLUTE): 0.1 x10E3/uL (ref 0.0–0.4)
Eos: 1 %
Hematocrit: 44.7 % (ref 34.0–46.6)
Hemoglobin: 14.6 g/dL (ref 11.1–15.9)
Immature Grans (Abs): 0 x10E3/uL (ref 0.0–0.1)
Immature Granulocytes: 0 %
Lymphocytes Absolute: 2.3 x10E3/uL (ref 0.7–3.1)
Lymphs: 32 %
MCH: 27 pg (ref 26.6–33.0)
MCHC: 32.7 g/dL (ref 31.5–35.7)
MCV: 83 fL (ref 79–97)
Monocytes Absolute: 0.6 x10E3/uL (ref 0.1–0.9)
Monocytes: 8 %
Neutrophils Absolute: 4 x10E3/uL (ref 1.4–7.0)
Neutrophils: 59 %
Platelets: 148 x10E3/uL — ABNORMAL LOW (ref 150–450)
RBC: 5.4 x10E6/uL — ABNORMAL HIGH (ref 3.77–5.28)
RDW: 13.3 % (ref 11.7–15.4)
WBC: 7 x10E3/uL (ref 3.4–10.8)

## 2024-01-01 LAB — HEMOGLOBIN A1C
Est. average glucose Bld gHb Est-mCnc: 134 mg/dL
Hgb A1c MFr Bld: 6.3 % — ABNORMAL HIGH (ref 4.8–5.6)

## 2024-01-01 LAB — LIPID PANEL
Chol/HDL Ratio: 5 ratio — ABNORMAL HIGH (ref 0.0–4.4)
Cholesterol, Total: 212 mg/dL — ABNORMAL HIGH (ref 100–199)
HDL: 42 mg/dL (ref >39)
LDL Chol Calc (NIH): 149 mg/dL — ABNORMAL HIGH (ref 0–99)
Triglycerides: 115 mg/dL (ref 0–149)
VLDL Cholesterol Cal: 21 mg/dL (ref 5–40)

## 2024-01-01 LAB — TSH: TSH: 0.592 u[IU]/mL (ref 0.450–4.500)

## 2024-01-01 MED ORDER — ATORVASTATIN CALCIUM 10 MG PO TABS: 10.0000 mg | ORAL_TABLET | Freq: Every day | ORAL | 3 refills | Status: DC

## 2024-01-01 MED ORDER — ATORVASTATIN CALCIUM 10 MG PO TABS: 10.0000 mg | ORAL_TABLET | Freq: Every day | ORAL | 1 refills | Status: AC

## 2024-01-01 MED ORDER — METFORMIN HCL 500 MG PO TABS: 500.0000 mg | ORAL_TABLET | Freq: Two times a day (BID) | ORAL | 3 refills | Status: DC

## 2024-01-01 MED ORDER — METFORMIN HCL 500 MG PO TABS: 500.0000 mg | ORAL_TABLET | Freq: Two times a day (BID) | ORAL | 1 refills | Status: AC

## 2024-01-01 NOTE — Progress Notes (Signed)
 If patient agrees  please place metformin 500 Mg once daily by mouth with breakfast, #30, 1 refill  Please place atorvastatin 10 mg once daily) once daily by month at bedtime #30, 1 refill

## 2024-01-03 ENCOUNTER — Encounter: Payer: Self-pay | Admitting: Physician Assistant

## 2024-01-03 DIAGNOSIS — K1370 Unspecified lesions of oral mucosa: Secondary | ICD-10-CM | POA: Insufficient documentation

## 2024-01-03 DIAGNOSIS — K219 Gastro-esophageal reflux disease without esophagitis: Secondary | ICD-10-CM | POA: Insufficient documentation

## 2024-01-03 DIAGNOSIS — F39 Unspecified mood [affective] disorder: Secondary | ICD-10-CM | POA: Insufficient documentation

## 2024-01-03 DIAGNOSIS — F17210 Nicotine dependence, cigarettes, uncomplicated: Secondary | ICD-10-CM | POA: Insufficient documentation

## 2024-01-03 DIAGNOSIS — G8929 Other chronic pain: Secondary | ICD-10-CM | POA: Insufficient documentation

## 2024-01-05 ENCOUNTER — Telehealth: Payer: Self-pay

## 2024-01-05 ENCOUNTER — Other Ambulatory Visit: Payer: Self-pay | Admitting: Physician Assistant

## 2024-01-05 DIAGNOSIS — K1370 Unspecified lesions of oral mucosa: Secondary | ICD-10-CM

## 2024-01-05 NOTE — Telephone Encounter (Signed)
 Copied from CRM [id number]. Topic: Clinical - Prescription Issue >> Jan 05, 2024  3:12 PM Tiffany S wrote: Reason for CRM: magic mouthwash (nystatin , lidocaine , diphenhydrAMINE, alum & mag hydroxide) suspension [[id number]]  Patient stated insurance will not cover please follow up with patient

## 2024-01-05 NOTE — Telephone Encounter (Signed)
 Janna, please review. Pt medication sent in is not covered by insurance, please advise.

## 2024-01-06 NOTE — Telephone Encounter (Signed)
 [facility] Mychart msg also sent

## 2024-02-10 ENCOUNTER — Ambulatory Visit: Payer: MEDICAID | Admitting: Physician Assistant

## 2024-02-10 ENCOUNTER — Other Ambulatory Visit (HOSPITAL_COMMUNITY)
Admission: RE | Admit: 2024-02-10 | Discharge: 2024-02-10 | Disposition: A | Discharge status: Home / Self Care | Payer: MEDICAID | Source: Ambulatory Visit | Attending: Physician Assistant | Admitting: Physician Assistant

## 2024-02-10 ENCOUNTER — Encounter: Payer: Self-pay | Admitting: Physician Assistant

## 2024-02-10 VITALS — BP 116/67 | HR 84 | Temp 97.2°F | Ht 63.0 in | Wt 247.6 lb

## 2024-02-10 DIAGNOSIS — Z0001 Encounter for general adult medical examination with abnormal findings: Secondary | ICD-10-CM | POA: Diagnosis not present

## 2024-02-10 DIAGNOSIS — R0602 Shortness of breath: Secondary | ICD-10-CM

## 2024-02-10 DIAGNOSIS — R319 Hematuria, unspecified: Secondary | ICD-10-CM

## 2024-02-10 DIAGNOSIS — N898 Other specified noninflammatory disorders of vagina: Secondary | ICD-10-CM | POA: Diagnosis present

## 2024-02-10 DIAGNOSIS — R8290 Unspecified abnormal findings in urine: Secondary | ICD-10-CM

## 2024-02-10 DIAGNOSIS — Z113 Encounter for screening for infections with a predominantly sexual mode of transmission: Secondary | ICD-10-CM

## 2024-02-10 DIAGNOSIS — F17210 Nicotine dependence, cigarettes, uncomplicated: Secondary | ICD-10-CM

## 2024-02-10 DIAGNOSIS — R062 Wheezing: Secondary | ICD-10-CM | POA: Diagnosis not present

## 2024-02-10 DIAGNOSIS — R053 Chronic cough: Secondary | ICD-10-CM

## 2024-02-10 DIAGNOSIS — Z0000 Encounter for general adult medical examination without abnormal findings: Secondary | ICD-10-CM

## 2024-02-10 DIAGNOSIS — Z780 Asymptomatic menopausal state: Secondary | ICD-10-CM

## 2024-02-10 DIAGNOSIS — J3089 Other allergic rhinitis: Secondary | ICD-10-CM

## 2024-02-10 DIAGNOSIS — K219 Gastro-esophageal reflux disease without esophagitis: Secondary | ICD-10-CM

## 2024-02-10 DIAGNOSIS — B9689 Other specified bacterial agents as the cause of diseases classified elsewhere: Secondary | ICD-10-CM

## 2024-02-10 LAB — POCT URINALYSIS DIPSTICK
Bilirubin, UA: NEGATIVE
Blood, UA: POSITIVE
Glucose, UA: NEGATIVE
Ketones, UA: NEGATIVE
Leukocytes, UA: NEGATIVE
Nitrite, UA: NEGATIVE
Odor: ABNORMAL
Protein, UA: NEGATIVE
Spec Grav, UA: 1.01 (ref 1.010–1.025)
Urobilinogen, UA: 0.2 U/dL
pH, UA: 5 (ref 5.0–8.0)

## 2024-02-10 MED ORDER — FAMOTIDINE 20 MG PO TABS: 20.0000 mg | ORAL_TABLET | Freq: Two times a day (BID) | ORAL | 1 refills | Status: AC

## 2024-02-10 MED ORDER — ALBUTEROL SULFATE HFA 108 (90 BASE) MCG/ACT IN AERS: 1.0000 | INHALATION_SPRAY | Freq: Four times a day (QID) | RESPIRATORY_TRACT | 0 refills | Status: AC | PRN

## 2024-02-10 NOTE — Progress Notes (Unsigned)
 Complete physical exam  Patient: Kimberly Burns   DOB: [birth year]   47 y.o. Female  MRN: [id number] Visit Date: 02/10/2024  Today's healthcare provider: Jolynn Spencer, PA-C   Chief Complaint  Patient presents with   Annual Exam    Patient presents for annual exam and pap smear, would like swab for stds bv and yeast as well.     Subjective    Kimberly Burns is a 48 y.o. female who presents today for a complete physical exam.  She reports consuming a {diet types:17450} diet. {Exercise:19826} She generally feels {well/fairly well/poorly:18703}. She reports sleeping {well/fairly well/poorly:18703}. She {does/does not:200015} have additional problems to discuss today.  HPI HPI     Annual Exam    Additional comments: Patient presents for annual exam and pap smear, would like swab for stds bv and yeast as well.        Last edited by Cherry Chiquita HERO, CMA on 02/10/2024  2:45 PM.      *** Discussed the use of AI scribe software for clinical note transcription with the patient, who gave verbal consent to proceed.  History of Present Illness Kimberly Burns is a 48 year old female who presents with shortness of breath and wheezing.  She experiences frequent shortness of breath and wheezing, with a clicking noise during breathing and a sensation of chest stuffiness. She is not on any medication for these symptoms. Anxiety related to personal stressors, including the recent loss of her sister and caregiving responsibilities, contributes to her difficulty sleeping. She stays up late and struggles to fall asleep.  She experiences hot flashes and sweating, and suspects she may be in menopause due to the absence of recent menstrual periods. She notes a strong urine smell and a slight vaginal discharge with odor. Her last Pap smear was approximately three years ago, though she is unsure of the exact timing or result.  She experiences occasional blurry vision, especially at night, and has  not had an eye examination in nearly three years. There is a family history of cancer, with her sister having passed away from metastatic cancer.  She experiences acid reflux symptoms and stomach cramps, particularly with certain movements. She has regular morning bowel movements and does not consume alcohol.    Last depression screening scores    12/31/2023    3:07 PM  PHQ 2/9 Scores  PHQ - 2 Score 4  PHQ- 9 Score 15   Last fall risk screening     No data to display         Last Audit-C alcohol use screening     No data to display         A score of 3 or more in women, and 4 or more in men indicates increased risk for alcohol abuse, EXCEPT if all of the points are from question 1   History reviewed. No pertinent past medical history. Past Surgical History:  Procedure Laterality Date   FOOT SURGERY     Social History   Socioeconomic History   Marital status: Single    Spouse name: Not on file   Number of children: Not on file   Years of education: Not on file   Highest education level: Not on file  Occupational History   Not on file  Tobacco Use   Smoking status: Every Day    Current packs/day: 0.50    Types: Cigarettes   Smokeless tobacco: Never  Substance and Sexual Activity  Alcohol use: Yes    Comment: Occasional   Drug use: No   Sexual activity: Yes  Other Topics Concern   Not on file  Social History Narrative   Not on file   Social Drivers of Health   Financial Resource Strain: Not on file  Food Insecurity: Not on file  Transportation Needs: Not on file  Physical Activity: Not on file  Stress: Not on file  Social Connections: Not on file  Intimate Partner Violence: Not on file   No family status information on file.   History reviewed. No pertinent family history. No Known Allergies  Patient Care Team: Shenice Dolder, PA-C as PCP - General (Physician Assistant)   Medications: Outpatient Medications Prior to Visit  Medication Sig    atorvastatin  (LIPITOR) 10 MG tablet Take 1 tablet (10 mg total) by mouth daily.   metFORMIN  (GLUCOPHAGE ) 500 MG tablet Take 1 tablet (500 mg total) by mouth 2 (two) times daily with a meal.   albuterol  (VENTOLIN  HFA) 108 (90 Base) MCG/ACT inhaler Inhale 1-2 puffs into the lungs every 6 (six) hours as needed.   promethazine -dextromethorphan (PROMETHAZINE -DM) 6.25-15 MG/5ML syrup Take 5 mLs by mouth 4 (four) times daily as needed. (Patient not taking: Reported on 12/31/2023)   [DISCONTINUED] amoxicillin -clavulanate (AUGMENTIN ) 875-125 MG tablet Take 1 tablet by mouth 2 (two) times daily.   [DISCONTINUED] magic mouthwash (nystatin , lidocaine , diphenhydrAMINE, alum & mag hydroxide) suspension Swish and spit 5 mLs 4 (four) times daily. Please, mix in equal ratio   No facility-administered medications prior to visit.    Review of Systems  All other systems reviewed and are negative.  Except see HPI  {Insert previous labs (optional):23779} {See past labs  Heme  Chem  Endocrine  Serology  Results Review (optional):1}  Objective    BP 116/67 (BP Location: Left Arm, Patient Position: Sitting, Cuff Size: Normal)   Pulse 84   Temp (!) 97.2 F (36.2 C) (Oral)   Ht 5' 3 (1.6 m)   Wt 247 lb 9.6 oz (112.3 kg)   SpO2 98%   BMI 43.86 kg/m  {Insert last BP/Wt (optional):23777}{See vitals history (optional):1}    Physical Exam Vitals reviewed.  Constitutional:      General: She is not in acute distress.    Appearance: Normal appearance. She is well-developed. She is not ill-appearing, toxic-appearing or diaphoretic.  HENT:     Head: Normocephalic and atraumatic.     Right Ear: Tympanic membrane, ear canal and external ear normal.     Left Ear: Tympanic membrane, ear canal and external ear normal.     Nose: Nose normal. No congestion or rhinorrhea.     Mouth/Throat:     Mouth: Mucous membranes are moist.     Pharynx: Oropharynx is clear. No oropharyngeal exudate.  Eyes:     General: No  scleral icterus.       Right eye: No discharge.        Left eye: No discharge.     Conjunctiva/sclera: Conjunctivae normal.     Pupils: Pupils are equal, round, and reactive to light.  Neck:     Thyroid: No thyromegaly.     Vascular: No carotid bruit.  Cardiovascular:     Rate and Rhythm: Normal rate and regular rhythm.     Pulses: Normal pulses.     Heart sounds: Normal heart sounds. No murmur heard.    No friction rub. No gallop.  Pulmonary:     Effort: Pulmonary effort is normal. No  respiratory distress.     Breath sounds: Normal breath sounds. No wheezing or rales.  Abdominal:     General: Abdomen is flat. Bowel sounds are normal. There is no distension.     Palpations: Abdomen is soft. There is no mass.     Tenderness: There is no abdominal tenderness. There is no right CVA tenderness, left CVA tenderness, guarding or rebound.     Hernia: No hernia is present.  Musculoskeletal:        General: No swelling, tenderness, deformity or signs of injury. Normal range of motion.     Cervical back: Normal range of motion and neck supple. No rigidity or tenderness.     Right lower leg: No edema.     Left lower leg: No edema.  Lymphadenopathy:     Cervical: No cervical adenopathy.  Skin:    General: Skin is warm and dry.     Coloration: Skin is not jaundiced or pale.     Findings: No bruising, erythema, lesion or rash.  Neurological:     Mental Status: She is alert and oriented to person, place, and time. Mental status is at baseline.     Gait: Gait normal.  Psychiatric:        Mood and Affect: Mood normal.        Behavior: Behavior normal.        Thought Content: Thought content normal.        Judgment: Judgment normal.      No results found for any visits on 02/10/24.  Assessment & Plan    Routine Health Maintenance and Physical Exam  Exercise Activities and Dietary recommendations  Goals   None     Immunization History  Administered Date(s) Administered   Hep A /  Hep B 06/05/2004, 02/15/2009   Influenza, Seasonal, Injecte, Preservative Fre 04/18/1996   Influenza,inj,Quad PF,6+ Mos 04/06/2019, 04/02/2022   Influenza,inj,quad, With Preservative 04/09/2016, 04/09/2017   Td 01/21/1996   Tdap 02/13/2015    Health Maintenance  Topic Date Due   HIV Screening  Never done   Hepatitis C Screening  Never done   Pneumococcal Vaccine (1 of 2 - PCV) Never done   Colon Cancer Screening  Never done   Pap with HPV screening  01/02/2023   Flu Shot  01/15/2024   COVID-19 Vaccine (1 - 2024-25 season) 02/26/2024*   Hepatitis B Vaccine (3 of 3 - 19+ 3-dose series) 02/09/2025*   DTaP/Tdap/Td vaccine (3 - Td or Tdap) 02/12/2025   HPV Vaccine  Aged Out   Meningitis B Vaccine  Aged Out  *Topic was postponed. The date shown is not the original due date.    Discussed health benefits of physical activity, and encouraged her to engage in regular exercise appropriate for her age and condition.  Assessment and Plan Assessment & Plan Shortness of breath and wheezing Intermittent wheezing and shortness of breath without cough. Differential includes asthma or allergic component. - Prescribed albuterol  for wheezing and shortness of breath.  Allergic rhinitis Symptoms suggest allergic rhinitis or possible bacterial infection. - Recommended over-the-counter antihistamines such as Allegra, Claritin, or Zyrtec. - Advised use of nasal saline spray and Flonase.  Menopausal symptoms Sweating and feeling hot may indicate menopausal symptoms or thyroid issues.  Gastroesophageal reflux disease (GERD) Cramps and acid reflux symptoms reported. No current GERD medication. - Prescribed Pepcid  twice daily. - Advised dietary modifications to avoid spicy and acidic foods. - Referred to gastroenterologist for further evaluation.  Vaginal discharge, rule out  infection Vaginal discharge and odor reported. No recent Pap smear, previous normal. - Ordered tests for bacterial vaginosis  and yeast infection.  Strong urine odor, rule out urinary tract infection Reports strong urine odor. - Checked urine sample.  1. Bacterial upper respiratory infection *** - albuterol  (VENTOLIN  HFA) 108 (90 Base) MCG/ACT inhaler; Inhale 1-2 puffs into the lungs every 6 (six) hours as needed.  Dispense: 8 g; Refill: 0  2. Annual physical exam (Primary) TD on dental/eye Things to do to keep yourself healthy  - Exercise at least 30-45 minutes a day, 3-4 days a week.  - Eat a low-fat diet with lots of fruits and vegetables, up to 7-9 servings per day.  - Seatbelts can save your life. Wear them always.  - Smoke detectors on every level of your home, check batteries every year.  - Eye Doctor - have an eye exam every 1-2 years  - Safe sex - if you may be exposed to STDs, use a condom.  - Alcohol -  If you drink, do it moderately, less than 2 drinks per day.  - Health Care Power of Attorney. Choose someone to speak for you if you are not able.  - Depression is common in our stressful world.If you're feeling down or losing interest in things you normally enjoy, please come in for a visit.  - Violence - If anyone is threatening or hurting you, please call immediately.   3. Morbid obesity (HCC) ***  4. Screening for STD (sexually transmitted disease) ***  5. Abnormal urine odor *** - POCT Urinalysis Dipstick - Urine Culture - Urinalysis, Routine w reflex microscopic  6. Hematuria, unspecified type *** - Urine Culture - Urinalysis, Routine w reflex microscopic  7. Gastroesophageal reflux disease, unspecified whether esophagitis present *** - Ambulatory referral to Gastroenterology - famotidine  (PEPCID ) 20 MG tablet; Take 1 tablet (20 mg total) by mouth 2 (two) times daily.  Dispense: 60 tablet; Refill: 1  8. Vaginal discharge *** - Cervicovaginal ancillary only - Cervicovaginal ancillary only   No follow-ups on file.    The patient was advised to call back or seek an  in-person evaluation if the symptoms worsen or if the condition fails to improve as anticipated.  I discussed the assessment and treatment plan with the patient. The patient was provided an opportunity to ask questions and all were answered. The patient agreed with the plan and demonstrated an understanding of the instructions.  I, Arlo Buffone, PA-C have reviewed all documentation for this visit. The documentation on 02/10/2024  for the exam, diagnosis, procedures, and orders are all accurate and complete.  Jolynn Spencer, [facility], MMS [address] [facility] [phone] (phone) [phone] (fax)  [facility]

## 2024-02-11 DIAGNOSIS — Z780 Asymptomatic menopausal state: Secondary | ICD-10-CM | POA: Insufficient documentation

## 2024-02-11 DIAGNOSIS — J309 Allergic rhinitis, unspecified: Secondary | ICD-10-CM | POA: Insufficient documentation

## 2024-02-11 DIAGNOSIS — Z0000 Encounter for general adult medical examination without abnormal findings: Secondary | ICD-10-CM | POA: Insufficient documentation

## 2024-02-11 DIAGNOSIS — K219 Gastro-esophageal reflux disease without esophagitis: Secondary | ICD-10-CM | POA: Insufficient documentation

## 2024-02-12 ENCOUNTER — Ambulatory Visit: Payer: Self-pay | Admitting: Physician Assistant

## 2024-02-12 LAB — URINALYSIS, ROUTINE W REFLEX MICROSCOPIC
Bilirubin, UA: NEGATIVE
Glucose, UA: NEGATIVE
Ketones, UA: NEGATIVE
Leukocytes,UA: NEGATIVE
Nitrite, UA: POSITIVE — AB
Protein,UA: NEGATIVE
RBC, UA: NEGATIVE
Specific Gravity, UA: 1.02 (ref 1.005–1.030)
Urobilinogen, Ur: 1 mg/dL (ref 0.2–1.0)
pH, UA: 6 (ref 5.0–7.5)

## 2024-02-12 LAB — MICROSCOPIC EXAMINATION
Casts: NONE SEEN /LPF
Epithelial Cells (non renal): NONE SEEN /HPF (ref 0–10)
WBC, UA: NONE SEEN /HPF (ref 0–5)

## 2024-02-12 LAB — CERVICOVAGINAL ANCILLARY ONLY
Bacterial Vaginitis (gardnerella): NEGATIVE
Candida Glabrata: NEGATIVE
Candida Vaginitis: NEGATIVE
Chlamydia: NEGATIVE
Comment: NEGATIVE
Comment: NEGATIVE
Comment: NEGATIVE
Comment: NEGATIVE
Comment: NEGATIVE
Comment: NORMAL
Neisseria Gonorrhea: NEGATIVE
Trichomonas: NEGATIVE

## 2024-02-13 LAB — URINE CULTURE

## 2024-02-24 ENCOUNTER — Ambulatory Visit: Admitting: Dietician

## 2024-02-26 ENCOUNTER — Other Ambulatory Visit (HOSPITAL_COMMUNITY)
Admission: RE | Admit: 2024-02-26 | Discharge: 2024-02-26 | Disposition: A | Discharge status: Home / Self Care | Payer: MEDICAID | Source: Ambulatory Visit | Attending: Physician Assistant | Admitting: Physician Assistant

## 2024-02-26 ENCOUNTER — Encounter: Payer: Self-pay | Admitting: Physician Assistant

## 2024-02-26 ENCOUNTER — Ambulatory Visit (INDEPENDENT_AMBULATORY_CARE_PROVIDER_SITE_OTHER): Payer: MEDICAID | Admitting: Physician Assistant

## 2024-02-26 VITALS — BP 127/85 | HR 84 | Resp 14 | Ht 63.0 in | Wt 251.0 lb

## 2024-02-26 DIAGNOSIS — R062 Wheezing: Secondary | ICD-10-CM

## 2024-02-26 DIAGNOSIS — K219 Gastro-esophageal reflux disease without esophagitis: Secondary | ICD-10-CM

## 2024-02-26 DIAGNOSIS — J3089 Other allergic rhinitis: Secondary | ICD-10-CM | POA: Diagnosis not present

## 2024-02-26 DIAGNOSIS — R0602 Shortness of breath: Secondary | ICD-10-CM

## 2024-02-26 DIAGNOSIS — N898 Other specified noninflammatory disorders of vagina: Secondary | ICD-10-CM | POA: Insufficient documentation

## 2024-02-26 DIAGNOSIS — R319 Hematuria, unspecified: Secondary | ICD-10-CM

## 2024-02-26 DIAGNOSIS — Z780 Asymptomatic menopausal state: Secondary | ICD-10-CM | POA: Diagnosis not present

## 2024-02-26 DIAGNOSIS — N39498 Other specified urinary incontinence: Secondary | ICD-10-CM

## 2024-02-26 DIAGNOSIS — F39 Unspecified mood [affective] disorder: Secondary | ICD-10-CM

## 2024-02-26 NOTE — Progress Notes (Signed)
 " Established patient visit  Patient: Kimberly Burns   DOB: [birth year]   48 y.o. Female  MRN: [id number] Visit Date: 02/26/2024  Today's healthcare provider: Jolynn Spencer, PA-C   Chief Complaint  Patient presents with   Wheezing    Usually in the morning, wants promethazine  refilled.   Abnormal Pap Smear   Subjective     HPI     Wheezing    Additional comments: Usually in the morning, wants promethazine  refilled.      Last edited by Wilfred Hargis RAMAN, CMA on 02/26/2024  3:28 PM.       Discussed the use of AI scribe software for clinical note transcription with the patient, who gave verbal consent to proceed.  History of Present Illness Kimberly Burns is a 48 year old female who presents with shortness of breath and menopausal symptoms.  She experiences shortness of breath, especially during long walks, and uses albuterol  every six hours with partial relief. She smokes and has wheezing and congestion. There is no prior diagnosis of COPD or asthma, but there is a family history of these conditions.  Menopausal symptoms include sweating and feeling hot for six months, which she describes as severe. She has not sought treatment for these symptoms. She has one child born via natural birth and experiences occasional incontinence when coughing.  She notices a vaginal discharge and uses panty liners. A previous urinalysis showed some bacteria and yeast, but a swab was negative for infection. She is concerned about recent weight management difficulties.       02/10/2024    2:58 PM 12/31/2023    3:07 PM  Depression screen PHQ 2/9  Decreased Interest 0 2  Down, Depressed, Hopeless 0 2  PHQ - 2 Score 0 4  Altered sleeping 2 3  Tired, decreased energy 2 3  Change in appetite 2 2  Feeling bad or failure about yourself  0 3  Trouble concentrating 0 0  Moving slowly or fidgety/restless 0   Suicidal thoughts 0 0  PHQ-9 Score 6 15  Difficult doing work/chores  Not difficult at all       02/10/2024    2:58 PM 12/31/2023    3:07 PM  GAD 7 : Generalized Anxiety Score  Nervous, Anxious, on Edge 0 0  Control/stop worrying 0 0  Worry too much - different things 0 3  Trouble relaxing 0 0  Restless 0 0  Easily annoyed or irritable 0 1  Afraid - awful might happen 0 0  Total GAD 7 Score 0 4  Anxiety Difficulty  Not difficult at all    Medications: Outpatient Medications Prior to Visit  Medication Sig   albuterol  (VENTOLIN  HFA) 108 (90 Base) MCG/ACT inhaler Inhale 1-2 puffs into the lungs every 6 (six) hours as needed.   atorvastatin  (LIPITOR) 10 MG tablet Take 1 tablet (10 mg total) by mouth daily.   famotidine  (PEPCID ) 20 MG tablet Take 1 tablet (20 mg total) by mouth 2 (two) times daily.   metFORMIN  (GLUCOPHAGE ) 500 MG tablet Take 1 tablet (500 mg total) by mouth 2 (two) times daily with a meal.   [DISCONTINUED] promethazine -dextromethorphan (PROMETHAZINE -DM) 6.25-15 MG/5ML syrup Take 5 mLs by mouth 4 (four) times daily as needed.   No facility-administered medications prior to visit.    Review of Systems  All other systems reviewed and are negative.  All negative Except see HPI       Objective    BP 127/85   Pulse  84   Resp 14   Ht 5' 3 (1.6 m)   Wt 251 lb (113.9 kg)   LMP 09/16/2018 (Approximate)   BMI 44.46 kg/m     Physical Exam Vitals reviewed.  Constitutional:      General: She is not in acute distress.    Appearance: Normal appearance. She is well-developed. She is not diaphoretic.  HENT:     Head: Normocephalic and atraumatic.  Eyes:     General: No scleral icterus.    Conjunctiva/sclera: Conjunctivae normal.  Neck:     Thyroid: No thyromegaly.  Cardiovascular:     Rate and Rhythm: Normal rate and regular rhythm.     Pulses: Normal pulses.     Heart sounds: Normal heart sounds. No murmur heard. Pulmonary:     Effort: Pulmonary effort is normal. No respiratory distress.     Breath sounds: Normal breath sounds. No wheezing,  rhonchi or rales.  Musculoskeletal:     Cervical back: Neck supple.     Right lower leg: No edema.     Left lower leg: No edema.  Lymphadenopathy:     Cervical: No cervical adenopathy.  Skin:    General: Skin is warm and dry.     Findings: No rash.  Neurological:     Mental Status: She is alert and oriented to person, place, and time. Mental status is at baseline.  Psychiatric:        Mood and Affect: Mood normal.        Behavior: Behavior normal.      Results for orders placed or performed in visit on 02/26/24  Urine Culture   Specimen: Urine   Urine  Result Value Ref Range   Urine Culture, Routine Final report    Organism ID, Bacteria No growth   Urinalysis, Routine w reflex microscopic  Result Value Ref Range   Specific Gravity, UA 1.017 1.005 - 1.030   pH, UA 7.5 5.0 - 7.5   Color, UA Yellow Yellow   Appearance Ur Cloudy (A) Clear   Leukocytes,UA Negative Negative   Protein,UA Negative Negative/Trace   Glucose, UA Negative Negative   Ketones, UA Negative Negative   RBC, UA Negative Negative   Bilirubin, UA Negative Negative   Urobilinogen, Ur 0.2 0.2 - 1.0 mg/dL   Nitrite, UA Negative Negative   Microscopic Examination Comment   Specimen status report  Result Value Ref Range   specimen status report Comment         Assessment & Plan Shortness of breath and wheezing Intermittent symptoms possibly related to allergies. Family history of respiratory issues. Albuterol  provides partial relief. Possible seasonal allergy exacerbation. - Prescribe albuterol  inhaler every four hours as needed. - Advise over-the-counter antihistamines. - Recommend nasal saline rinse and Nasacort or Flonase.  Allergic rhinitis Symptoms suggest allergic rhinitis with nasal congestion and wheezing. Possible seasonal exacerbation due to allergen exposure. - Advise over-the-counter antihistamines. - Recommend nasal saline rinse and Nasacort or Flonase.  Menopausal  symptoms Experiencing menopausal symptoms including sweating and possible atrophic vaginitis. - Refer to OBGYN for assessment and management, including potential hormonal therapy.  Vaginal discharge, possible infection Abnormal discharge with previous urinalysis showing bacteria and yeast. Negative urine culture. Possible infection without definitive diagnosis. - Perform vaginal self-swab for evaluation. - Repeat urine culture.  Urinary incontinence and possible pelvic organ prolapse Occasional incontinence, especially with coughing. Possible pelvic organ prolapse. - Refer to OBGYN for evaluation. - Discuss treatment options including Kegel exercises and pelvic floor physical  therapy.  Morbid Obesity Chronic and stable Body mass index is 44.46 kg/m. Difficulty with weight management. Interested in weight loss program and medication pending insurance coverage. - Advise contacting insurance for medication coverage. - Discuss weight loss management program and medication options if covered. Weight loss of 5% of pt's current weight via healthy diet and daily exercise encouraged.   Vaginal discharge - Urinalysis, Routine w reflex microscopic - Urine Culture - Cervicovaginal ancillary only  Hematuria, unspecified type Improved No blood in urine Will follow-up   Orders Placed This Encounter  Procedures   Urine Culture   Urinalysis, Routine w reflex microscopic   Specimen status report    Return in about 4 weeks (around 03/25/2024) for chronic disease f/u.   The patient was advised to call back or seek an in-person evaluation if the symptoms worsen or if the condition fails to improve as anticipated.  I discussed the assessment and treatment plan with the patient. The patient was provided an opportunity to ask questions and all were answered. The patient agreed with the plan and demonstrated an understanding of the instructions.  I, Berma Harts, PA-C have reviewed all  documentation for this visit. The documentation on 02/26/2024  for the exam, diagnosis, procedures, and orders are all accurate and complete.  Jolynn Spencer, [facility], MMS [facility] [phone] (phone) [phone] (fax)  [facility] "

## 2024-02-27 LAB — URINALYSIS, ROUTINE W REFLEX MICROSCOPIC
Bilirubin, UA: NEGATIVE
Glucose, UA: NEGATIVE
Ketones, UA: NEGATIVE
Leukocytes,UA: NEGATIVE
Nitrite, UA: NEGATIVE
Protein,UA: NEGATIVE
RBC, UA: NEGATIVE
Specific Gravity, UA: 1.017 (ref 1.005–1.030)
Urobilinogen, Ur: 0.2 mg/dL (ref 0.2–1.0)
pH, UA: 7.5 (ref 5.0–7.5)

## 2024-02-28 LAB — URINE CULTURE: Organism ID, Bacteria: NO GROWTH

## 2024-02-28 LAB — SPECIMEN STATUS REPORT

## 2024-03-01 LAB — CERVICOVAGINAL ANCILLARY ONLY
Bacterial Vaginitis (gardnerella): NEGATIVE
Candida Glabrata: NEGATIVE
Candida Vaginitis: NEGATIVE
Chlamydia: NEGATIVE
Comment: NEGATIVE
Comment: NEGATIVE
Comment: NEGATIVE
Comment: NEGATIVE
Comment: NEGATIVE
Comment: NORMAL
Neisseria Gonorrhea: NEGATIVE
Trichomonas: NEGATIVE

## 2024-03-02 ENCOUNTER — Ambulatory Visit: Payer: Self-pay | Admitting: Physician Assistant

## 2024-03-25 ENCOUNTER — Ambulatory Visit: Payer: MEDICAID | Admitting: Physician Assistant

## 2024-04-05 ENCOUNTER — Encounter: Payer: MEDICAID | Admitting: Obstetrics & Gynecology

## 2024-04-19 ENCOUNTER — Encounter: Payer: Self-pay | Admitting: Obstetrics & Gynecology

## 2024-04-19 ENCOUNTER — Other Ambulatory Visit (HOSPITAL_COMMUNITY)
Admission: RE | Admit: 2024-04-19 | Discharge: 2024-04-19 | Disposition: A | Discharge status: Home / Self Care | Payer: MEDICAID | Source: Ambulatory Visit | Attending: Obstetrics & Gynecology | Admitting: Obstetrics & Gynecology

## 2024-04-19 ENCOUNTER — Ambulatory Visit: Payer: MEDICAID | Admitting: Obstetrics & Gynecology

## 2024-04-19 VITALS — BP 124/77 | HR 78 | Ht 63.0 in | Wt 248.6 lb

## 2024-04-19 DIAGNOSIS — R252 Cramp and spasm: Secondary | ICD-10-CM | POA: Diagnosis not present

## 2024-04-19 DIAGNOSIS — Z1211 Encounter for screening for malignant neoplasm of colon: Secondary | ICD-10-CM

## 2024-04-19 DIAGNOSIS — Z124 Encounter for screening for malignant neoplasm of cervix: Secondary | ICD-10-CM

## 2024-04-19 DIAGNOSIS — Z1231 Encounter for screening mammogram for malignant neoplasm of breast: Secondary | ICD-10-CM

## 2024-04-19 NOTE — Progress Notes (Signed)
    GYNECOLOGY PROGRESS NOTE  Subjective:    Patient ID: Kimberly Burns, female    DOB: May 19, 1976, 48 y.o.   MRN: [id number]  HPI  Patient is a 48 y.o.monogamous P1 (64 yo daughter, 1 granddaughter) here today as a new patient with the issue of being worried about having a cancer. Her died recently with an unknown cancer. Her partner has brain cancer currently. Her only symptom right now is some cramping under her ribs on both sides. She has a primary care provider. As far as gynecology goes she had a pap smear in 2021. She would like one today. She had some abnormal cells lasered off of her cervix in 1997 but reports normal paps since then. She has had negative Aptima including GC and CT testing at her primary care in August and September. She had her last period around age 60, denies PMB.  The following portions of the patient's history were reviewed and updated as appropriate: allergies, current medications, past family history, past medical history, past social history, past surgical history, and problem list.  Review of Systems Pertinent items are noted in HPI.  Her 37 yo sister recently died from an unknown cancer. No FH of breast, gyn, or colon cancers. She has been abstinent for about 2 years (partner with brain cancer).  Objective:   Height 5' 3 (1.6 m), weight 248 lb 9.6 oz (112.8 kg), last menstrual period 09/16/2018. Body mass index is 44.04 kg/m. Well nourished, well hydrated Black female, no apparent distress She is ambulating and conversing normally. Breast exam normal bilaterally EG- mild atrophy Spec exam- all normal appearing Bimanual exam- no palpable masses, no pain  Assessment:   Routine screening for breast, cervical and colon cancers  Plan:   Pap, cologard and mammogram ordered Rec [facility] monthly

## 2024-04-21 LAB — CYTOLOGY - PAP
Comment: NEGATIVE
Comment: NEGATIVE
Comment: NEGATIVE
Diagnosis: NEGATIVE
HPV 16: NEGATIVE
HPV 18 / 45: NEGATIVE
High risk HPV: POSITIVE — AB

## 2024-04-25 ENCOUNTER — Ambulatory Visit: Payer: Self-pay | Admitting: Obstetrics & Gynecology
